# Patient Record
Sex: Male | Born: 1990 | Race: Black or African American | Hispanic: No | Marital: Single | Smoking: Current every day smoker
Health system: Southern US, Community
[De-identification: ages and names within clinical notes are randomized; demographics above are authoritative.]

---
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
## 2010-05-28 LAB — BASIC METABOLIC PANEL
BUN: 17 mg/dL (ref 6–23)
CO2: 22 mEq/L (ref 19–32)
CO2: 23 mEq/L (ref 19–32)
CO2: 26 mEq/L (ref 19–32)
CO2: 26 mEq/L (ref 19–32)
Calcium: 8.5 mg/dL (ref 8.4–10.5)
Calcium: 8.6 mg/dL (ref 8.4–10.5)
Calcium: 9.2 mg/dL (ref 8.4–10.5)
Chloride: 100 mEq/L (ref 96–112)
Chloride: 110 mEq/L (ref 96–112)
Chloride: 111 mEq/L (ref 96–112)
Chloride: 111 mEq/L (ref 96–112)
Creatinine, Ser: 1.15 mg/dL (ref 0.4–1.5)
Creatinine, Ser: 1.63 mg/dL — ABNORMAL HIGH (ref 0.4–1.5)
GFR calc Af Amer: >60 mL/min (ref >60)
GFR calc Af Amer: >60 mL/min (ref >60)
GFR calc Af Amer: >60 mL/min (ref >60)
GFR calc non Af Amer: >60 mL/min (ref >60)
Glucose, Bld: 112 mg/dL — ABNORMAL HIGH (ref 70–99)
Glucose, Bld: 80 mg/dL (ref 70–99)
Potassium: 4.5 mEq/L (ref 3.5–5.1)
Sodium: 138 mEq/L (ref 135–145)
Sodium: 142 mEq/L (ref 135–145)

## 2010-05-28 LAB — TSH: TSH: 1.808 u[IU]/mL (ref 0.350–4.500)

## 2010-05-28 LAB — URINE CULTURE
Colony Count: 15000
Culture  Setup Time: 201108080002
Special Requests: NEGATIVE

## 2010-05-28 LAB — RAPID URINE DRUG SCREEN, HOSP PERFORMED
Barbiturates: NOT DETECTED
Benzodiazepines: NOT DETECTED
Cocaine: NOT DETECTED

## 2010-05-28 LAB — CK TOTAL AND CKMB (NOT AT ARMC)
CK, MB: 13 ng/mL (ref 0.3–4.0)
CK, MB: 15 ng/mL (ref 0.3–4.0)
Relative Index: 0.3 (ref 0.0–2.5)
Total CK: 4792 U/L — ABNORMAL HIGH (ref 7–232)

## 2010-05-28 LAB — CBC
HCT: 37.1 % — ABNORMAL LOW (ref 39.0–52.0)
HCT: 39.8 % (ref 39.0–52.0)
Hemoglobin: 12.3 g/dL — ABNORMAL LOW (ref 13.0–17.0)
MCHC: 33.4 g/dL (ref 30.0–36.0)
MCV: 84.3 fL (ref 78.0–100.0)
RBC: 4.39 MIL/uL (ref 4.22–5.81)
RBC: 4.72 MIL/uL (ref 4.22–5.81)
RDW: 14.3 % (ref 11.5–15.5)
WBC: 12.1 10*3/uL — ABNORMAL HIGH (ref 4.0–10.5)
WBC: 8.1 10*3/uL (ref 4.0–10.5)

## 2010-05-28 LAB — URINALYSIS, ROUTINE W REFLEX MICROSCOPIC
Specific Gravity, Urine: 1.011 (ref 1.005–1.030)
pH: 5.5 (ref 5.0–8.0)

## 2010-05-28 LAB — PHOSPHORUS
Phosphorus: 2.7 mg/dL (ref 2.3–4.6)
Phosphorus: 5.6 mg/dL — ABNORMAL HIGH (ref 2.3–4.6)

## 2010-05-28 LAB — DIFFERENTIAL
Monocytes Absolute: 1 10*3/uL (ref 0.1–1.0)
Neutro Abs: 4.1 10*3/uL (ref 1.7–7.7)

## 2010-05-28 LAB — LACTIC ACID, PLASMA: Lactic Acid, Venous: 1.5 mmol/L (ref 0.5–2.2)

## 2010-05-28 LAB — MAGNESIUM: Magnesium: 1.9 mg/dL (ref 1.5–2.5)

## 2011-10-03 ENCOUNTER — Emergency Department (INDEPENDENT_AMBULATORY_CARE_PROVIDER_SITE_OTHER)
Admission: EM | Admit: 2011-10-03 | Discharge: 2011-10-03 | Disposition: A | Discharge status: Home / Self Care | Payer: Self-pay | Source: Home / Self Care | Attending: Emergency Medicine | Admitting: Emergency Medicine

## 2011-10-03 ENCOUNTER — Encounter (HOSPITAL_COMMUNITY): Payer: Self-pay

## 2011-10-03 DIAGNOSIS — S0502XA Injury of conjunctiva and corneal abrasion without foreign body, left eye, initial encounter: Secondary | ICD-10-CM

## 2011-10-03 DIAGNOSIS — S058X9A Other injuries of unspecified eye and orbit, initial encounter: Secondary | ICD-10-CM

## 2011-10-03 MED ORDER — TETRACAINE HCL 0.5 % OP SOLN
2.0000 [drp] | Freq: Once | OPHTHALMIC | Status: AC
  Administered 2011-10-03: 2 [drp] via OPHTHALMIC

## 2011-10-03 MED ORDER — TETRACAINE HCL 0.5 % OP SOLN
OPHTHALMIC | Status: AC
  Filled 2011-10-03: qty 2

## 2011-10-03 MED ORDER — ERYTHROMYCIN 5 MG/GM OP OINT: TOPICAL_OINTMENT | OPHTHALMIC | Status: AC

## 2011-10-03 MED ORDER — HYDROCODONE-ACETAMINOPHEN 5-500 MG PO TABS: 1.0000 | ORAL_TABLET | Freq: Four times a day (QID) | ORAL | Status: AC | PRN

## 2011-10-03 MED ORDER — POLYMYXIN B-TRIMETHOPRIM 10000-0.1 UNIT/ML-% OP SOLN: 1.0000 [drp] | Freq: Four times a day (QID) | OPHTHALMIC | Status: AC

## 2011-10-03 NOTE — ED Notes (Signed)
Reportedly has already been evaluated by professional at school for insect FB left eye 1 week ago, and used Rx eye drops w worsening symptoms , eye draining clear fluid, pain worse

## 2011-10-03 NOTE — ED Provider Notes (Signed)
History     CSN: [id number]  Arrival date & time 10/03/11  1401   First MD Initiated Contact with Patient 10/03/11 1404      Chief Complaint  Patient presents with  . Eye Problem    (Consider location/radiation/quality/duration/timing/severity/associated sxs/prior treatment) HPI Comments: 21 year old obese college male with no otherwise significant past medical history. Comes complaining of over week with left eye redness tearing and clear discharge. Symptoms also associated with photophobia. Does not wear contact lenses. Patient reports that he was running and a "gnat" insect got into his left eye since then, the left eye has been red, tender and tearing. Symptoms worsening during the last few days. He reported these symptoms to the college doctor over the phone and he prescribed a medication that the patient has been using without relief. Patient does not know the name of this medication.    History reviewed. No pertinent past medical history.  History reviewed. No pertinent past surgical history.  History reviewed. No pertinent family history.  History  Substance Use Topics  . Smoking status: Not on file  . Smokeless tobacco: Not on file  . Alcohol Use: Not on file      Review of Systems  Constitutional:       10 systems reviewed and  pertinent negative and positive symptoms are as per HPI.     Eyes: Positive for photophobia, pain and redness.       Eye tearing as per history of present illness  All other systems reviewed and are negative.    Allergies  Review of patient's allergies indicates not on file.  Home Medications   Current Outpatient Rx  Name Route Sig Dispense Refill  . ERYTHROMYCIN 5 MG/GM OP OINT  Place a 1/2 inch ribbon of ointment into the left lower eyelid every 6 hours for 5 days. 1 g 0  . HYDROCODONE-ACETAMINOPHEN 5-500 MG PO TABS Oral Take 1 tablet by mouth every 6 (six) hours as needed for pain. 10 tablet 0  . POLYMYXIN B-TRIMETHOPRIM  10000-0.1 UNIT/ML-% OP SOLN Left Eye Place 1 drop into the left eye every 6 (six) hours. 10 mL 0    BP 117/66  Pulse 51  Temp 98.6 F (37 C) (Oral)  Resp 18  SpO2 100%  Physical Exam  Nursing note and vitals reviewed. Constitutional: He is oriented to person, place, and time. He appears well-developed and well-nourished. No distress.       Obese uncomfortable, photophobic.   HENT:  Head: Normocephalic and atraumatic.  Mouth/Throat: Oropharynx is clear and moist.  Eyes: EOM are normal. Pupils are equal, round, and reactive to light.       Right eye: With normal examination . Left eye: Patient uncomfortable with photophobia improved after tetracaine application. Constant clear eye tearing. No penetrating trauma. Conjunctival erythema.  pericilliary reaction.  No foreign bodies on upper lid eversion and lower inner conjunctiva careful examination.  On fluorescein stain there are several dots uptake in the central cornea. There is a horizontal staining in the left lower corneal quadrant over the iris. No ulcers or pulling. No periorbital erythema, edema or tenderness to palpation. No obvious exudates   Neck: No thyromegaly present.  Cardiovascular: Normal heart sounds.   Pulmonary/Chest: Breath sounds normal.  Lymphadenopathy:    He has no cervical adenopathy.  Neurological: He is alert and oriented to person, place, and time.  Skin: No rash noted. He is not diaphoretic.    ED Course  Procedures (including critical care  time)  Labs Reviewed - No data to display No results found.   1. Corneal abrasion, left       MDM  Corneal linear abrasion on fluorescein staining. Concerning the length of the symptoms despite possible prior antibiotic tx. Case was discussed with Dr. Burgess Estelle ophthalmologist on call who would see patient for followup tomorrow in his office. Prescribed erythromycin ointment, Polytrim and Vicodin. Patient was instructed/encouraged to followup with the  ophthalmologist tomorrow.        Sharin Grave, MD 10/05/11 [phone]

## 2012-09-22 ENCOUNTER — Encounter (HOSPITAL_COMMUNITY): Payer: Self-pay | Admitting: Emergency Medicine

## 2012-09-22 ENCOUNTER — Inpatient Hospital Stay (HOSPITAL_COMMUNITY)
Admission: AD | Admit: 2012-09-22 | Discharge: 2012-09-23 | DRG: 881 | Disposition: A | Discharge status: Home / Self Care | Payer: No Typology Code available for payment source | Source: Intra-hospital | Attending: Psychiatry | Admitting: Psychiatry

## 2012-09-22 ENCOUNTER — Encounter (HOSPITAL_COMMUNITY): Payer: Self-pay | Admitting: *Deleted

## 2012-09-22 ENCOUNTER — Emergency Department (HOSPITAL_COMMUNITY)
Admission: EM | Admit: 2012-09-22 | Discharge: 2012-09-22 | Disposition: A | Discharge status: Other Acute Inpatient Hospital | Payer: Self-pay | Attending: Emergency Medicine | Admitting: Emergency Medicine

## 2012-09-22 DIAGNOSIS — F1010 Alcohol abuse, uncomplicated: Secondary | ICD-10-CM | POA: Insufficient documentation

## 2012-09-22 DIAGNOSIS — F329 Major depressive disorder, single episode, unspecified: Secondary | ICD-10-CM | POA: Insufficient documentation

## 2012-09-22 DIAGNOSIS — T1491XA Suicide attempt, initial encounter: Secondary | ICD-10-CM

## 2012-09-22 DIAGNOSIS — T50992A Poisoning by other drugs, medicaments and biological substances, intentional self-harm, initial encounter: Secondary | ICD-10-CM | POA: Insufficient documentation

## 2012-09-22 DIAGNOSIS — T466X4A Poisoning by antihyperlipidemic and antiarteriosclerotic drugs, undetermined, initial encounter: Secondary | ICD-10-CM | POA: Insufficient documentation

## 2012-09-22 DIAGNOSIS — Z79899 Other long term (current) drug therapy: Secondary | ICD-10-CM | POA: Insufficient documentation

## 2012-09-22 DIAGNOSIS — F411 Generalized anxiety disorder: Secondary | ICD-10-CM | POA: Diagnosis present

## 2012-09-22 DIAGNOSIS — F4321 Adjustment disorder with depressed mood: Principal | ICD-10-CM | POA: Diagnosis present

## 2012-09-22 DIAGNOSIS — F17200 Nicotine dependence, unspecified, uncomplicated: Secondary | ICD-10-CM | POA: Diagnosis present

## 2012-09-22 DIAGNOSIS — F3289 Other specified depressive episodes: Secondary | ICD-10-CM | POA: Insufficient documentation

## 2012-09-22 DIAGNOSIS — F322 Major depressive disorder, single episode, severe without psychotic features: Secondary | ICD-10-CM

## 2012-09-22 DIAGNOSIS — T50991A Poisoning by other drugs, medicaments and biological substances, accidental (unintentional), initial encounter: Secondary | ICD-10-CM | POA: Insufficient documentation

## 2012-09-22 LAB — ETHANOL: Alcohol, Ethyl (B): <11 mg/dL (ref 0–11)

## 2012-09-22 LAB — POCT I-STAT TROPONIN I: Troponin i, poc: 0 ng/mL (ref 0.00–0.08)

## 2012-09-22 LAB — CBC WITH DIFFERENTIAL/PLATELET
Basophils Absolute: 0 10*3/uL (ref 0.0–0.1)
Eosinophils Relative: 1 % (ref 0–5)
Lymphocytes Relative: 29 % (ref 12–46)
MCV: 81.1 fL (ref 78.0–100.0)
Neutrophils Relative %: 62 % (ref 43–77)
Platelets: 248 10*3/uL (ref 150–400)
RBC: 5.18 MIL/uL (ref 4.22–5.81)
RDW: 14.1 % (ref 11.5–15.5)
WBC: 7.8 10*3/uL (ref 4.0–10.5)

## 2012-09-22 LAB — SALICYLATE LEVEL: Salicylate Lvl: <2 mg/dL — ABNORMAL LOW (ref 2.8–20.0)

## 2012-09-22 LAB — BASIC METABOLIC PANEL
CO2: 26 mEq/L (ref 19–32)
Calcium: 9.8 mg/dL (ref 8.4–10.5)
GFR calc non Af Amer: 79 mL/min — ABNORMAL LOW (ref >90)
Potassium: 3.7 mEq/L (ref 3.5–5.1)
Sodium: 137 mEq/L (ref 135–145)

## 2012-09-22 LAB — RAPID URINE DRUG SCREEN, HOSP PERFORMED
Benzodiazepines: NOT DETECTED
Opiates: NOT DETECTED

## 2012-09-22 MED ORDER — ALUM & MAG HYDROXIDE-SIMETH 200-200-20 MG/5ML PO SUSP: 30.0000 mL | ORAL | Status: DC | PRN

## 2012-09-22 MED ORDER — NICOTINE 21 MG/24HR TD PT24
21.0000 mg | MEDICATED_PATCH | Freq: Every day | TRANSDERMAL | Status: DC
  Filled 2012-09-22: qty 1

## 2012-09-22 MED ORDER — NICOTINE 21 MG/24HR TD PT24
21.0000 mg | MEDICATED_PATCH | Freq: Every day | TRANSDERMAL | Status: DC
  Filled 2012-09-22 (×3): qty 1

## 2012-09-22 MED ORDER — ACETAMINOPHEN 500 MG PO TABS: 1000.0000 mg | ORAL_TABLET | Freq: Once | ORAL | Status: DC

## 2012-09-22 MED ORDER — ONDANSETRON HCL 4 MG PO TABS: 4.0000 mg | ORAL_TABLET | Freq: Three times a day (TID) | ORAL | Status: DC | PRN

## 2012-09-22 MED ORDER — ACETAMINOPHEN 325 MG PO TABS: 650.0000 mg | ORAL_TABLET | ORAL | Status: DC | PRN

## 2012-09-22 MED ORDER — MAGNESIUM HYDROXIDE 400 MG/5ML PO SUSP: 30.0000 mL | Freq: Every day | ORAL | Status: DC | PRN

## 2012-09-22 MED ORDER — LORAZEPAM 1 MG PO TABS: 1.0000 mg | ORAL_TABLET | Freq: Three times a day (TID) | ORAL | Status: DC | PRN

## 2012-09-22 MED ORDER — ACETAMINOPHEN 325 MG PO TABS: 650.0000 mg | ORAL_TABLET | Freq: Four times a day (QID) | ORAL | Status: DC | PRN

## 2012-09-22 NOTE — ED Notes (Signed)
WUJ:[id number]<JY> Expected date:09/22/12<BR> Expected time: 4:24 AM<BR> Means of arrival:Ambulance<BR> Comments:<BR> Overdose

## 2012-09-22 NOTE — BH Assessment (Signed)
Assessment Note   Earl Mccarthy is an 22 y.o. male.   Pt overdosed last night after girlfriend packed her stuff and left after pt caught her sexting.  Pt is depressed.  Denies HI and AVH and SA.  However pt does have occasional glass of wine or drink of alcohol but denies any regular use.  Pt and girlfriend just got an apartment together and pt is shocked she left.  Pt is a Consulting civil engineer at [employer].  Pt list his Aunt as a support.    See Nurse Practitioner note for additional clinical.  Pt can't reliably contract for safety even though presently he is admitting the suicide attempt was a mistake.    Pt has been accepted to a 500 hall floor at [facility] by [facility].    Axis I: Major Depression, single episode Axis II: Deferred Axis III: History reviewed. No pertinent past medical history. Axis IV: other psychosocial or environmental problems and problems related to social environment Axis V: 41-50 serious symptoms  Past Medical History: History reviewed. No pertinent past medical history.  History reviewed. No pertinent past surgical history.  Family History: No family history on file.  Social History:  has no tobacco, alcohol, and drug history on file.  Additional Social History:  Alcohol / Drug Use Pain Medications: na Prescriptions: na Over the Counter: na History of alcohol / drug use?: No history of alcohol / drug abuse Longest period of sobriety (when/how long): na  CIWA: CIWA-Ar BP: 120/80 mmHg Pulse Rate: 62 COWS:    Allergies:  Allergies  Allergen Reactions  . Bee Venom Anaphylaxis  . Strawberry Anaphylaxis    Home Medications:  (Not in a hospital admission)  OB/GYN Status:  No LMP for male patient.  General Assessment Data Location of Assessment: [facility] ED Living Arrangements: Alone Can pt return to current living arrangement?: Yes Admission Status: Voluntary Is patient capable of signing voluntary admission?: Yes Transfer from: Acute Hospital Referral Source:  MD  Education Status Is patient currently in school?: Yes Current Grade: college Highest grade of school patient has completed: college Name of school: [employer] Contact person: na  Risk to self Suicidal Ideation: No-Not Currently/Within Last 6 Months Suicidal Intent: No-Not Currently/Within Last 6 Months Is patient at risk for suicide?: Yes Suicidal Plan?: No-Not Currently/Within Last 6 Months Access to Means: Yes Specify Access to Suicidal Means: has medication What has been your use of drugs/alcohol within the last 12 months?: na Previous Attempts/Gestures: No How many times?: 0 Other Self Harm Risks: na Triggers for Past Attempts: None known Intentional Self Injurious Behavior: None Family Suicide History: No Recent stressful life event(s): Other (Comment) (girlfriend broke up with him) Persecutory voices/beliefs?: No Depression: Yes Depression Symptoms: Isolating;Fatigue;Guilt;Loss of interest in usual pleasures;Feeling worthless/self pity;Feeling angry/irritable Substance abuse history and/or treatment for substance abuse?: No Suicide prevention information given to non-admitted patients: Not applicable  Risk to Others Homicidal Ideation: No Thoughts of Harm to Others: No Current Homicidal Intent: No Current Homicidal Plan: No Access to Homicidal Means: No Identified Victim: na History of harm to others?: No Assessment of Violence: None Noted Violent Behavior Description: cooperative  Does patient have access to weapons?: No Criminal Charges Pending?: No Does patient have a court date: No  Psychosis Hallucinations: None noted Delusions: None noted  Mental Status Report Appear/Hygiene: Disheveled Eye Contact: Fair Motor Activity: Unremarkable Speech: Logical/coherent Level of Consciousness: Alert Mood: Depressed;Anxious;Worthless, low self-esteem;Sad Affect: Depressed;Blunted;Anxious;Sad Anxiety Level: Severe Thought Processes: Coherent Judgement:  Impaired Orientation: Person;Place;Situation;Appropriate for developmental  age Obsessive Compulsive Thoughts/Behaviors: None  Cognitive Functioning Concentration: Decreased Memory: Recent Intact;Remote Intact IQ: Average Insight: Poor Impulse Control: Poor Appetite: Fair Weight Loss: 0 Weight Gain: 0 Sleep: Decreased Total Hours of Sleep: 4 Vegetative Symptoms: None  ADLScreening [facility]) Patient's cognitive ability adequate to safely complete daily activities?: Yes Patient able to express need for assistance with ADLs?: Yes Independently performs ADLs?: Yes (appropriate for developmental age)  Abuse/Neglect [facility]) Physical Abuse: Denies Verbal Abuse: Denies Sexual Abuse: Denies  Prior Inpatient Therapy Prior Inpatient Therapy: No Prior Therapy Dates: na Prior Therapy Facilty/Provider(s): na Reason for Treatment: na  Prior Outpatient Therapy Prior Outpatient Therapy: No Prior Therapy Dates: na Prior Therapy Facilty/Provider(s): na Reason for Treatment: na  ADL Screening (condition at time of admission) Patient's cognitive ability adequate to safely complete daily activities?: Yes Is the patient deaf or have difficulty hearing?: Yes Does the patient have difficulty seeing, even when wearing glasses/contacts?: Yes Does the patient have difficulty concentrating, remembering, or making decisions?: Yes Patient able to express need for assistance with ADLs?: Yes Does the patient have difficulty dressing or bathing?: Yes Independently performs ADLs?: Yes (appropriate for developmental age) Does the patient have difficulty walking or climbing stairs?: Yes Weakness of Legs: None Weakness of Arms/Hands: None  Home Assistive Devices/Equipment Home Assistive Devices/Equipment: None  Therapy Consults (therapy consults require a physician order) PT Evaluation Needed: No OT Evalulation Needed: No SLP Evaluation Needed: No Abuse/Neglect Assessment (Assessment  to be complete while patient is alone) Physical Abuse: Denies Verbal Abuse: Denies Sexual Abuse: Denies Exploitation of patient/patient's resources: Denies Self-Neglect: Denies Values / Beliefs Cultural Requests During Hospitalization: None Spiritual Requests During Hospitalization: None Consults Spiritual Care Consult Needed: No Social Work Consult Needed: No Merchant navy officer (For Healthcare) Advance Directive: Patient does not have advance directive Pre-existing out of facility DNR order (yellow form or pink MOST form): No    Additional Information 1:1 In Past 12 Months?: No CIRT Risk: No Elopement Risk: No Does patient have medical clearance?: Yes     Disposition:  Disposition Initial Assessment Completed for this Encounter: Yes Disposition of Patient: Inpatient treatment program Type of inpatient treatment program: Adult  On Site Evaluation by:   Reviewed with Physician:     Titus Mould, Eppie Gibson 09/22/2012 11:06 AM

## 2012-09-22 NOTE — Progress Notes (Signed)
Patient ID: Earl Mccarthy, male   DOB: 08/23/1989, 22 y.o.   MRN: [id number]  D: Pt denies SI/HI/AVH. Pt is pleasant and cooperative. Pt states "I'm her just because I was wilin out, I got drunk and just took some pills and my phone was dead and no one could get in touch with me". Pt concerned about animals left in apartment with no food and water for over 24 hrs (cats and a dog). Pt has depressed look and is obsessed with leaving " I got 2 jobs and I have too many people counting on me to be up in here"   A: Pt was offered support and encouragement. Pt was given scheduled medications. Pt was encourage to attend groups. Q 15 minute checks were done for safety.   R:Pt attends groups and interacts well with peers and staff. Pt is taking medication. Pt has no complaints at this time.Pt receptive to treatment and safety maintained on unit.

## 2012-09-22 NOTE — ED Provider Notes (Signed)
   History    CSN: [id number] Arrival date & time 09/22/12  0430  First MD Initiated Contact with Patient 09/22/12 0456     Chief Complaint  Patient presents with  . Suicide Attempt   (Consider location/radiation/quality/duration/timing/severity/associated sxs/prior Treatment) HPI History provided by pt.   Pt attempted suicide sometime last night or early this morning.  Drank a large amt of alcohol and then took several hydroxycut and cellucor (main ingredient linoleic acid).  Has been very depressed lately.  Has never been treated for psychiatric illness in the past.  Denies HI as well as illicit drug abuse.    History reviewed. No pertinent past medical history. History reviewed. No pertinent past surgical history. No family history on file. History  Substance Use Topics  . Smoking status: Not on file  . Smokeless tobacco: Not on file  . Alcohol Use: Not on file    Review of Systems  All other systems reviewed and are negative.    Allergies  Bee venom and Strawberry  Home Medications   Current Outpatient Rx  Name  Route  Sig  Dispense  Refill  . OVER THE COUNTER MEDICATION   Oral   Take 1 tablet by mouth 3 (three) times daily. OTC weight loss supplement          BP 147/78  Pulse 80  Temp(Src) 98.1 F (36.7 C) (Oral)  Resp 20  SpO2 100% Physical Exam  Nursing note and vitals reviewed. Constitutional: He is oriented to person, place, and time. He appears well-developed and well-nourished. No distress.  HENT:  Head: Normocephalic and atraumatic.  Eyes:  Normal appearance  Neck: Normal range of motion.  Cardiovascular: Normal rate and regular rhythm.   Pulmonary/Chest: Effort normal and breath sounds normal. No respiratory distress.  Musculoskeletal: Normal range of motion.  Neurological: He is alert and oriented to person, place, and time.  Skin: Skin is warm and dry. No rash noted.  Psychiatric: He has a normal mood and affect. His behavior is normal.     ED Course  Procedures (including critical care time)  Date: 09/22/2012  Rate: 61  Rhythm: normal sinus rhythm  QRS Axis: normal  Intervals: PR shortened  ST/T Wave abnormalities: nonspecific T wave changes  Conduction Disutrbances:none  Narrative Interpretation:   Old EKG Reviewed: none available  Labs Reviewed  CBC WITH DIFFERENTIAL  BASIC METABOLIC PANEL  ETHANOL  SALICYLATE LEVEL  URINE RAPID DRUG SCREEN (HOSP PERFORMED)   No results found. 1. Suicide attempt     MDM  22yo healthy M presents w/ suicide attempt.  Drank a large amt of alcohol and took several hydroxycut and cellcor (lineolic acid).  Medically cleared w/ exception of non-specific T wave changes and shortened PR interval on EKG.  0 and 3hr troponins ordered.  Pt will need cardiology referral at time of discharge.  Holding orders written.  He wanted to be discharged but I explained that he would have to be involuntarily committed if he attempted to leave.  6:54 AM     Otilio Miu, PA-C 09/22/12 [phone]

## 2012-09-22 NOTE — ED Notes (Signed)
Report given to [facility]. Will notify security of transportation need to [facility].

## 2012-09-22 NOTE — Tx Team (Signed)
Initial Interdisciplinary Treatment Plan  PATIENT STRENGTHS: (choose at least two) Ability for insight Average or above average intelligence Communication skills Supportive family/friends Work skills  PATIENT STRESSORS: Loss of girlfriend   PROBLEM LIST: Problem List/Patient Goals Date to be addressed Date deferred Reason deferred Estimated date of resolution  Major depression ,single episode 09-22-2012           Suicide ideation  09-22-2012           Loss of girlfriend  And child  09-22-12                              DISCHARGE CRITERIA:  Improved stabilization in mood, thinking, and/or behavior Reduction of life-threatening or endangering symptoms to within safe limits  PRELIMINARY DISCHARGE PLAN: Attend aftercare/continuing care group Return to previous living arrangement Return to previous work or school arrangements  PATIENT/FAMIILY INVOLVEMENT: This treatment plan has been presented to and reviewed with the patient, Earl Mccarthy, and/or family member, .  The patient and family have been given the opportunity to ask questions and make suggestions.  Valente David 09/22/2012, 1:17 PM

## 2012-09-22 NOTE — ED Notes (Signed)
Ambulatory without difficulty. No complaints voiced. Belongings bag x1 given to hospital security. Transported to [facility] by security and mental health tech.

## 2012-09-22 NOTE — Consult Note (Signed)
Reason for Consult: Evaluation for inpatient treatment Referring Physician:  EDP  Earl Mccarthy is an 22 y.o. male.  HPI:  Patient presents to  [facility] after drinking a bottle of wine and intentional overdose of hydroxycut.  Patient states that he has multiple stressors work, school, and family.  Patient states that he and the mother of his child (girlfriend) move in together.  Last night girlfriend move out after patient asked her to stop sexting other guys.  Patient states that he found out after he was sent one of the sexting text by accident; states that girlfriend has also been caught cheating before.  Patient states that the stressor which caused the overdose is "I want to be a part of my sons life."  Patient states that he has been depressed for a while but it has worsened since the birth of the child and the attitude of the girlfriend.  Patient states that he dos not have a history of behavioral or psychiatric problems.  States that his girlfriend is bipolar and he has been with her to therapy but she refuses to take her medications.   Patient is willing to do outpatient therapy through [employer] after his discharge.   History reviewed. No pertinent past medical history.  History reviewed. No pertinent past surgical history.  No family history on file.  Social History:  has no tobacco, alcohol, and drug history on file.  Allergies:  Allergies  Allergen Reactions  . Bee Venom Anaphylaxis  . Strawberry Anaphylaxis    Medications: I have reviewed the patient's current medications.  Results for orders placed during the hospital encounter of 09/22/12 (from the past 48 hour(s))  CBC WITH DIFFERENTIAL     Status: None   Collection Time    09/22/12  6:25 AM      Result Value Range   WBC 7.8  4.0 - 10.5 K/uL   RBC 5.18  4.22 - 5.81 MIL/uL   Hemoglobin 14.4  13.0 - 17.0 g/dL   HCT 19.1  47.8 - 29.5 %   MCV 81.1  78.0 - 100.0 fL   MCH 27.8  26.0 - 34.0 pg   MCHC 34.3  30.0 - 36.0  g/dL   RDW 62.1  30.8 - 65.7 %   Platelets 248  150 - 400 K/uL   Neutrophils Relative % 62  43 - 77 %   Neutro Abs 4.8  1.7 - 7.7 K/uL   Lymphocytes Relative 29  12 - 46 %   Lymphs Abs 2.3  0.7 - 4.0 K/uL   Monocytes Relative 9  3 - 12 %   Monocytes Absolute 0.7  0.1 - 1.0 K/uL   Eosinophils Relative 1  0 - 5 %   Eosinophils Absolute 0.1  0.0 - 0.7 K/uL   Basophils Relative 0  0 - 1 %   Basophils Absolute 0.0  0.0 - 0.1 K/uL  BASIC METABOLIC PANEL     Status: Abnormal   Collection Time    09/22/12  6:25 AM      Result Value Range   Sodium 137  135 - 145 mEq/L   Potassium 3.7  3.5 - 5.1 mEq/L   Chloride 102  96 - 112 mEq/L   CO2 26  19 - 32 mEq/L   Glucose, Bld 111 (*) 70 - 99 mg/dL   BUN 13  6 - 23 mg/dL   Creatinine, Ser 8.46  0.50 - 1.35 mg/dL   Calcium 9.8  8.4 - 96.2  mg/dL   GFR calc non Af Amer 79 (*) >90 mL/min   GFR calc Af Amer >90  >90 mL/min   Comment:            The eGFR has been calculated     using the CKD EPI equation.     This calculation has not been     validated in all clinical     situations.     eGFR's persistently     <90 mL/min signify     possible Chronic Kidney Disease.  ETHANOL     Status: None   Collection Time    09/22/12  6:25 AM      Result Value Range   Alcohol, Ethyl (B) <11  0 - 11 mg/dL   Comment:            LOWEST DETECTABLE LIMIT FOR     SERUM ALCOHOL IS 11 mg/dL     FOR MEDICAL PURPOSES ONLY  SALICYLATE LEVEL     Status: Abnormal   Collection Time    09/22/12  6:25 AM      Result Value Range   Salicylate Lvl <2.0 (*) 2.8 - 20.0 mg/dL  URINE RAPID DRUG SCREEN (HOSP PERFORMED)     Status: None   Collection Time    09/22/12  8:07 AM      Result Value Range   Opiates NONE DETECTED  NONE DETECTED   Cocaine NONE DETECTED  NONE DETECTED   Benzodiazepines NONE DETECTED  NONE DETECTED   Amphetamines NONE DETECTED  NONE DETECTED   Tetrahydrocannabinol NONE DETECTED  NONE DETECTED   Barbiturates NONE DETECTED  NONE DETECTED    Comment:            DRUG SCREEN FOR MEDICAL PURPOSES     ONLY.  IF CONFIRMATION IS NEEDED     FOR ANY PURPOSE, NOTIFY LAB     WITHIN 5 DAYS.                LOWEST DETECTABLE LIMITS     FOR URINE DRUG SCREEN     Drug Class       Cutoff (ng/mL)     Amphetamine      1000     Barbiturate      200     Benzodiazepine   200     Tricyclics       300     Opiates          300     Cocaine          300     THC              50  POCT I-STAT TROPONIN I     Status: None   Collection Time    09/22/12  8:23 AM      Result Value Range   Troponin i, poc 0.00  0.00 - 0.08 ng/mL   Comment 3            Comment: Due to the release kinetics of cTnI,     a negative result within the first hours     of the onset of symptoms does not rule out     myocardial infarction with certainty.     If myocardial infarction is still suspected,     repeat the test at appropriate intervals.    No results found.  Review of Systems  Psychiatric/Behavioral: Positive for depression and suicidal ideas. Negative for hallucinations, memory loss  and substance abuse. The patient is nervous/anxious. The patient does not have insomnia.    Blood pressure 120/80, pulse 62, temperature 97.7 F (36.5 C), temperature source Oral, resp. rate 18, SpO2 99.00%. Physical Exam  Constitutional: He is oriented to person, place, and time. He appears well-developed and well-nourished.  HENT:  Head: Normocephalic and atraumatic.  Neck: Normal range of motion.  Respiratory: Effort normal.  Musculoskeletal: Normal range of motion.  Neurological: He is alert and oriented to person, place, and time.  Skin: Skin is warm and dry.  Psychiatric: His speech is normal and behavior is normal. His mood appears anxious. Cognition and memory are normal. He expresses impulsivity. He exhibits a depressed mood. He expresses suicidal ideation. He expresses suicidal plans.   Face to face interview and consulted with Dr. Lolly Mustache Assessment/Plan: Axis I:  Major Depression, single episode  Recommendation:  Inpatient treatment.  Patient accepted [facility] 508/02.  1. Admit for crisis management and stabilization.  2. Review and initiate  medications pertinent to patient illness and treatment.  3. Medication management to reduce current symptoms to base line and improve the         patient's overall level of functioning.   Rankin, Shuvon, FNP-BC 09/22/2012, 11:07 AM     I have personally seen the patient and agreed with the findings and involved in the treatment plan. Kathryne Sharper, MD

## 2012-09-22 NOTE — ED Notes (Signed)
Pt arrived via EMS with a complaint of suicidal intent.  Pt states that his girlfriend has left him, he drank heavily and contacted his ex-girlfriend and made statements that he was going to cut himself.  Pt has taken a mixture of dietary supplements this am.  Pt is having family problems as well and is having a difficult time not being in control.

## 2012-09-22 NOTE — ED Provider Notes (Signed)
Medical screening examination/treatment/procedure(s) were performed by non-physician practitioner and as supervising physician I was immediately available for consultation/collaboration.  Liv Rallis, MD 09/22/12 2302 

## 2012-09-22 NOTE — BH Assessment (Signed)
[facility] Assessment Progress Note  Spoke to Scherrie Merritts, LCSW, Assessment Counselor.  He reports that [employer], FNP has seen pt and accepted him to [facility] pending bed availability.  Spoke to Berneice Heinrich, RN, Neurosurgeon, who assigned pt to Rm [phone] to the service of Leata Mouse, MD.  At 11:20 I called back to Reita Cliche to notify him.  Doylene Canning, MA Assessment Counselor 09/22/2012 @ 11:21
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
## 2012-09-23 DIAGNOSIS — F4321 Adjustment disorder with depressed mood: Principal | ICD-10-CM

## 2012-09-23 NOTE — Progress Notes (Signed)
[facility] Adult Case Management Discharge Plan :  Will you be returning to the same living situation after discharge: No.  Will be staying with his aunt a few days. At discharge, do you have transportation home?:Yes,  aunt Do you have the ability to pay for your medications:  N/A - none prescribed  Release of information consent forms completed and in the chart;  Patient's signature needed at discharge.  Patient to Follow up at: Follow-up Information   Schedule an appointment as soon as possible for a visit with [facility] . (Patient and his aunt are taking responsibility for making an appointment for individual counseling as soon as possible.)    Contact information:   Lucy Chris, Room [address] (T) [phone] Monday - Friday; 8am - 5pm        Patient denies SI/HI:   Yes,  adamantly denies suicidality    Safety Planning and Suicide Prevention discussed:  Yes,  with patient and with aunt, and brochure given  Sarina Ser 09/23/2012, 12:47 PM

## 2012-09-23 NOTE — [facility] Suicide Risk Assessment (Signed)
Suicide Risk Assessment  Discharge Assessment     Demographic Factors:  Male  Mental Status Per Nursing Assessment::   On Admission:  Suicidal ideation indicated by patient  Current Mental Status by Physician: Patient seen.  Patient denies any suicidal thoughts or homicidal thoughts.  Patient to he was very upset because his girlfriend was out of town.  Patient denies any hallucination paranoia or any obsessive thoughts.  Denies any active or passive suicidal thoughts at this time.  He is cooperative pleasant and maintained good eye contact.  His speech is clear and coherent.  His thought process is logical.  He denies any auditory or visual hallucination.  He's alert and oriented x3.  His insight judgment and impulse control is okay.  Loss Factors: Decrease in vocational status  Historical Factors: NA  Risk Reduction Factors:   Sense of responsibility to family, Religious beliefs about death, Employed, Living with another person, especially a relative, Positive social support, Positive therapeutic relationship and Positive coping skills or problem solving skills  Continued Clinical Symptoms:  Severe Anxiety and/or Agitation Dysthymia  Cognitive Features That Contribute To Risk:  Polarized thinking    Suicide Risk:  Mild:  Suicidal ideation of limited frequency, intensity, duration, and specificity.  There are no identifiable plans, no associated intent, mild dysphoria and related symptoms, good self-control (both objective and subjective assessment), few other risk factors, and identifiable protective factors, including available and accessible social support.  Discharge Diagnoses:   AXIS I:  Adjustment Disorder with Depressed Mood AXIS II:  Deferred AXIS III:  History reviewed. No pertinent past medical history. AXIS IV:  problems with primary support group AXIS V:  61-70 mild symptoms  Plan Of Care/Follow-up recommendations:  Activity:  As tolerated Diet:  Unchanged from  the past Other:  Patient will be discharged to his on.  Patient agreed to followup with counselor at [facility]. patient is not on any medication.  Is patient on multiple antipsychotic therapies at discharge:  No   Has Patient had three or more failed trials of antipsychotic monotherapy by history:  No  Recommended Plan for Multiple Antipsychotic Therapies: Taper to monotherapy as described:  Patient is not taking any psychotropic medication.  He will be followed at the counselor to deal with his psychosocial issues.  Earl Mccarthy T. 09/23/2012, 12:53 PM

## 2012-09-23 NOTE — Clinical Social Work Psych Note (Signed)
CSW met with patient in his room to discuss possible discharge today.  He admitted that he really felt at the moment of his suicide attempt that he did not need to be in the world.  He states, however, that he has seen people in the hospital who truly deal with these issues every day of their lives, and he does not believe his problems are as big as he had thought.  He readily admits that his alcohol consumption exacerbated his feelings, and that he does not normally drink so this was worsened.  He expressed understanding that he now knows the negative effect alcohol has on his thinking processes when he is upset and that he needs to refrain from alcohol consumption under those circumstances.  Patient was asked directly about his comments in group yesterday about cutting himself, when in fact his assessment did not state anything about cuts.  He stated that indeed he did not cut himself and he was speaking metaphorically when he said he had "cut too deep", meaning that he had gone too far.    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  His 27mo son is a motivator for him to live and make a different life for the son than he had himself, as he had to be raised by his aunt due to conflict between his parents.  He stated that "even if she can't be in my life" (meaning his girlfriend, mother of his baby), I will be okay because I will be there for my son, no matter what.  He anticipates that a custody battle may be coming up soon, and he agrees that counseling throughout that process would be helpful.  Provided patient with SPE, including the fact that he is at greater risk since he has shown a willingness to make a suicide attempt.  Met with aunt, see family SPE note.  She is taking responsibility for him, will take him to stay with her at least a few days.  Agrees he can return to work Tuesday, and letter was given to that effect.  Ambrose Mantle, LCSW 09/23/2012, 12:55 PM

## 2012-09-23 NOTE — Progress Notes (Signed)
[facility] Group Notes:  (Nursing/MHT/Case Management/Adjunct)  Date:  7/12/214 Time:  2000  Type of Therapy:  Psychoeducational Skills  Participation Level:  Active  Participation Quality:  Appropriate  Affect:  Appropriate  Cognitive:  Appropriate  Insight:  Lacking  Engagement in Group:  Engaged  Modes of Intervention:  Education  Summary of Progress/Problems: The patient expressed in group that he enjoyed the groups that were held earlier in the day. He admitted that he is having difficulty adjusting to the hospital since he is accustomed to being more independent. His goal for tomorrow is to get discharged.   Caileen Veracruz S 09/23/2012, 1:47 AM

## 2012-09-23 NOTE — Progress Notes (Signed)
D) Pt is being discharged to home accompanied by his mother. Mood and affect are appropriate. Denies SI and HI. States he feels bad about the girlfriend who is the mother of his baby, but will not attempt to hurt himself again. States it was "a stupid move and it won't happen again".  Verbalizes concern over the animals that were left in his care. Pt's mother was present and states that she will be staying with him for awhile and making sure that he does his follow up care. A) All belongings returned to Pt. Given support, reassurance, praise and encouragement. Provided with a 1:1 R) Pt deneis SI and HI. Plans to follow up with his outpatient plan

## 2012-09-23 NOTE — Progress Notes (Signed)
The focus of this group is to help patients establish daily goals to achieve during treatment and discuss how the patient can incorporate goal setting into their daily lives to aide in recovery.  Pt did not attend. Pt met with NP
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
## 2012-09-23 NOTE — [facility] Suicide Risk Assessment (Signed)
[facility] INPATIENT:  Family/Significant Other Suicide Prevention Education  Suicide Prevention Education:  Education Completed; Earl Mccarthy [phone] has been identified by the patient as the family member/significant other with whom the patient will be residing, and identified as the person(s) who will aid the patient in the event of a mental health crisis (suicidal ideations/suicide attempt).  With written consent from the patient, the family member/significant other has been provided the following suicide prevention education, prior to the and/or following the discharge of the patient.  The suicide prevention education provided includes the following:  Suicide risk factors  Suicide prevention and interventions  National Suicide Hotline telephone number  [facility] assessment telephone number  [facility] Emergency Assistance 911  [facility] and/or Residential Mobile Crisis Unit telephone number  Request made of family/significant other to:  Remove weapons (e.g., guns, rifles, knives), all items previously/currently identified as safety concern.  Aunt states there are no weapons in the home other than kitchen knives.  Remove drugs/medications (over-the-counter, prescriptions, illicit drugs), all items previously/currently identified as a safety concern.  The family member/significant other verbalizes understanding of the suicide prevention education information provided.  The family member/significant other agrees to remove the items of safety concern listed above.  Aunt is a Teaching laboratory technician and is knowledgeable about these issues.  Patient will live with her for a few days.  Earl Mccarthy 09/23/2012, 12:42 PM

## 2012-09-25 NOTE — Progress Notes (Signed)
Patient Discharge Instructions:  After Visit Summary (AVS):   Faxed to:  09/25/12 Discharge Summary Note:   Faxed to:  09/25/12 Psychiatric Admission Assessment Note:   Faxed to:  09/25/12 Suicide Risk Assessment - Discharge Assessment:   Faxed to:  09/25/12 Faxed/Sent to the Next Level Care provider:  09/25/12 Faxed to [facility] @ [phone]  Jerelene Redden, 09/25/2012, 4:03 PM

## 2013-06-11 ENCOUNTER — Encounter (HOSPITAL_COMMUNITY): Payer: Self-pay | Admitting: Emergency Medicine

## 2013-06-11 ENCOUNTER — Emergency Department (HOSPITAL_COMMUNITY)
Admission: EM | Admit: 2013-06-11 | Discharge: 2013-06-12 | Disposition: A | Discharge status: Home / Self Care | Payer: Self-pay | Attending: Emergency Medicine | Admitting: Emergency Medicine

## 2013-06-11 DIAGNOSIS — F3289 Other specified depressive episodes: Secondary | ICD-10-CM | POA: Insufficient documentation

## 2013-06-11 DIAGNOSIS — R55 Syncope and collapse: Secondary | ICD-10-CM

## 2013-06-11 DIAGNOSIS — IMO0002 Reserved for concepts with insufficient information to code with codable children: Secondary | ICD-10-CM | POA: Insufficient documentation

## 2013-06-11 DIAGNOSIS — S99929A Unspecified injury of unspecified foot, initial encounter: Secondary | ICD-10-CM | POA: Insufficient documentation

## 2013-06-11 DIAGNOSIS — S8990XA Unspecified injury of unspecified lower leg, initial encounter: Secondary | ICD-10-CM | POA: Insufficient documentation

## 2013-06-11 DIAGNOSIS — S199XXA Unspecified injury of neck, initial encounter: Secondary | ICD-10-CM

## 2013-06-11 DIAGNOSIS — S99919A Unspecified injury of unspecified ankle, initial encounter: Secondary | ICD-10-CM

## 2013-06-11 DIAGNOSIS — Y92410 Unspecified street and highway as the place of occurrence of the external cause: Secondary | ICD-10-CM | POA: Insufficient documentation

## 2013-06-11 DIAGNOSIS — M542 Cervicalgia: Secondary | ICD-10-CM

## 2013-06-11 DIAGNOSIS — M549 Dorsalgia, unspecified: Secondary | ICD-10-CM

## 2013-06-11 DIAGNOSIS — Y9389 Activity, other specified: Secondary | ICD-10-CM | POA: Insufficient documentation

## 2013-06-11 DIAGNOSIS — S0993XA Unspecified injury of face, initial encounter: Secondary | ICD-10-CM | POA: Insufficient documentation

## 2013-06-11 DIAGNOSIS — F329 Major depressive disorder, single episode, unspecified: Secondary | ICD-10-CM | POA: Insufficient documentation

## 2013-06-11 DIAGNOSIS — F17200 Nicotine dependence, unspecified, uncomplicated: Secondary | ICD-10-CM | POA: Insufficient documentation
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
## 2013-06-12 ENCOUNTER — Encounter (HOSPITAL_COMMUNITY): Payer: Self-pay | Admitting: Radiology

## 2013-06-12 ENCOUNTER — Emergency Department (HOSPITAL_COMMUNITY): Payer: Self-pay

## 2013-06-12 ENCOUNTER — Emergency Department (HOSPITAL_COMMUNITY): Payer: No Typology Code available for payment source

## 2013-06-12 LAB — I-STAT CHEM 8, ED
BUN: 11 mg/dL (ref 6–23)
Calcium, Ion: 1.2 mmol/L (ref 1.12–1.23)
Chloride: 102 mEq/L (ref 96–112)
Creatinine, Ser: 1.4 mg/dL — ABNORMAL HIGH (ref 0.50–1.35)
Glucose, Bld: 92 mg/dL (ref 70–99)
HCT: 45 % (ref 39.0–52.0)
HEMOGLOBIN: 15.3 g/dL (ref 13.0–17.0)
Potassium: 4.3 mEq/L (ref 3.7–5.3)
SODIUM: 141 meq/L (ref 137–147)
TCO2: 26 mmol/L (ref 0–100)

## 2013-06-12 MED ORDER — HYDROCODONE-ACETAMINOPHEN 5-325 MG PO TABS
2.0000 | ORAL_TABLET | Freq: Once | ORAL | Status: AC
  Administered 2013-06-12: 2 via ORAL
  Filled 2013-06-12: qty 2
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
## 2013-06-12 NOTE — Discharge Instructions (Signed)
Follow up with local physician or heart doctor for further evaluation of possible syncope (passing out). Return if you develop persistent numbness, weakness, urinary changes, passing out or chest pain. Call to schedule your MRI of your neck and followup with a local physician for the results. If you were given medicines take as directed.  If you are on coumadin or contraceptives realize their levels and effectiveness is altered by many different medicines.  If you have any reaction (rash, tongues swelling, other) to the medicines stop taking and see a physician.   Please follow up as directed and return to the ER or see a physician for new or worsening symptoms.  Thank you. Filed Vitals:   06/12/13 0052 06/12/13 0139 06/12/13 0215 06/12/13 0245  BP: 138/74  113/50 107/56  Pulse: 79 60 65 52  Temp:      TempSrc:      Resp: 19 16    SpO2: 99% 100% 98% 99%

## 2015-09-27 ENCOUNTER — Emergency Department (HOSPITAL_COMMUNITY)
Admission: EM | Admit: 2015-09-27 | Discharge: 2015-09-28 | Disposition: A | Discharge status: Home / Self Care | Payer: Managed Care, Other (non HMO) | Attending: Emergency Medicine | Admitting: Emergency Medicine

## 2015-09-27 ENCOUNTER — Encounter (HOSPITAL_COMMUNITY): Payer: Self-pay | Admitting: Oncology

## 2015-09-27 DIAGNOSIS — R300 Dysuria: Secondary | ICD-10-CM | POA: Insufficient documentation

## 2015-09-27 DIAGNOSIS — R369 Urethral discharge, unspecified: Secondary | ICD-10-CM

## 2015-09-27 DIAGNOSIS — R110 Nausea: Secondary | ICD-10-CM | POA: Insufficient documentation

## 2015-09-27 DIAGNOSIS — F17210 Nicotine dependence, cigarettes, uncomplicated: Secondary | ICD-10-CM | POA: Diagnosis not present

## 2015-09-27 DIAGNOSIS — Z113 Encounter for screening for infections with a predominantly sexual mode of transmission: Secondary | ICD-10-CM | POA: Diagnosis not present

## 2015-09-27 NOTE — ED Notes (Signed)
Pt developed a whitish yellow discharge from his penis as well as burning w/ urination.  Sx started on 7/14.  Has had unprotected sex w/ his ex girlfriend.

## 2015-09-28 MED ORDER — LIDOCAINE HCL 1 % IJ SOLN
INTRAMUSCULAR | Status: AC
  Administered 2015-09-28: 0.9 mL
  Filled 2015-09-28: qty 20

## 2015-09-28 MED ORDER — AZITHROMYCIN 250 MG PO TABS
1000.0000 mg | ORAL_TABLET | Freq: Once | ORAL | Status: AC
  Administered 2015-09-28: 1000 mg via ORAL
  Filled 2015-09-28: qty 4

## 2015-09-28 MED ORDER — CEFTRIAXONE SODIUM 250 MG IJ SOLR
250.0000 mg | Freq: Once | INTRAMUSCULAR | Status: AC
  Administered 2015-09-28: 250 mg via INTRAMUSCULAR
  Filled 2015-09-28: qty 250

## 2015-09-28 NOTE — ED Provider Notes (Signed)
CSN: [id number]     Arrival date & time 09/27/15  2349 History  By signing my name below, I, Earl Mccarthy, attest that this documentation has been prepared under the direction and in the presence of Earl Mccarthy, [place]. Electronically Signed: Bridgette HabermannMaria Mccarthy, ED Scribe. 09/28/2015. 1:28 AM.   Chief Complaint  Patient presents with  . SEXUALLY TRANSMITTED DISEASE   The history is provided by the patient. No language interpreter was used.    HPI Comments: Earl Mccarthy is a 25 y.o. male who presents to the Emergency Department complaining of white/yellow penile discharge onset 09/25/15. Pt also has dysuria and mild nausea. Pt is sexually active and states he had unprotected sex with his ex girlfriend within the last week. Pt denies fever, testicular swelling/pain, and vomiting. Pt has no other complaints at this time.  History reviewed. No pertinent past medical history. History reviewed. No pertinent past surgical history. No family history on file. Social History  Substance Use Topics  . Smoking status: Current Every Day Smoker -- 0.25 packs/day for .5 years    Types: Cigarettes  . Smokeless tobacco: None  . Alcohol Use: Yes    Review of Systems A complete 10 system review of systems was obtained and all systems are negative except as noted in the HPI and PMH.   Allergies  Bee venom and Strawberry extract  Home Medications   Prior to Admission medications   Not on File   BP 150/76 mmHg  Pulse 94  Temp(Src) 98.3 F (36.8 C) (Oral)  Resp 18  Ht 6\' 4"  (1.93 m)  Wt 340 lb (154.223 kg)  BMI 41.40 kg/m2  SpO2 98% Physical Exam  Constitutional: He appears well-developed and well-nourished.  HENT:  Head: Normocephalic.  Eyes: Conjunctivae are normal.  Cardiovascular: Normal rate.   Pulmonary/Chest: Effort normal. No respiratory distress.  Abdominal: He exhibits no distension.  Genitourinary: Right testis shows no swelling and no tenderness. Left testis shows no swelling and no  tenderness. Uncircumcised. No penile erythema or penile tenderness. Discharge found.  Chaperone present throughout entire exam.   Musculoskeletal: Normal range of motion.  Neurological: He is alert.  Skin: Skin is warm and dry.  Psychiatric: He has a normal mood and affect. His behavior is normal.  Nursing note and vitals reviewed.   ED Course  Procedures  DIAGNOSTIC STUDIES: Oxygen Saturation is 98% on RA, normal by my interpretation.    COORDINATION OF CARE: 1:26 AM Discussed treatment plan with pt at bedside which includes urinalysis and lab work and pt agreed to plan.  Labs Review Labs Reviewed  HIV ANTIBODY (ROUTINE TESTING)  RPR  GC/CHLAMYDIA PROBE AMP [facility]    Imaging Review No results found. I have personally reviewed and evaluated these images and lab results as part of my medical decision-making.   EKG Interpretation None      MDM   Final diagnoses:  Penile discharge  Encounter for screening examination for sexually transmitted disease    GC chlamydia, HIV, RPR sent. Treated empirically today with rocephin and azithromycin. Safe sex practices discussed. Instructed pcp f/u. ER return precautions given.   I personally performed the services described in this documentation, which was scribed in my presence. The recorded information has been reviewed and is accurate.   Carlene CoriaSerena Y Vence Lalor, PA-C 09/28/15 [phone]  Shon Batonourtney F Horton, MD 09/28/15 2256

## 2015-09-28 NOTE — ED Notes (Signed)
Attempted to draw labs was unsuccessful. Forde DandyRn Elaine made aware.

## 2015-09-28 NOTE — Discharge Instructions (Signed)
You were treated empirically today for gonorrhea and syphilis. We will call you if any of your tests come back positive. Abstain from sex for at least one week. Always use a condom. Return to the ER for new or worsening symptoms.

## 2015-09-29 LAB — HIV ANTIBODY (ROUTINE TESTING W REFLEX): HIV Screen 4th Generation wRfx: NONREACTIVE

## 2015-09-29 LAB — RPR: RPR: NONREACTIVE

## 2017-10-11 ENCOUNTER — Emergency Department (HOSPITAL_COMMUNITY)
Admission: EM | Admit: 2017-10-11 | Discharge: 2017-10-11 | Disposition: A | Discharge status: Home / Self Care | Payer: Managed Care, Other (non HMO) | Attending: Emergency Medicine | Admitting: Emergency Medicine

## 2017-10-11 ENCOUNTER — Encounter (HOSPITAL_COMMUNITY): Payer: Self-pay | Admitting: Emergency Medicine

## 2017-10-11 ENCOUNTER — Other Ambulatory Visit: Payer: Self-pay

## 2017-10-11 ENCOUNTER — Emergency Department (HOSPITAL_COMMUNITY): Payer: Managed Care, Other (non HMO)

## 2017-10-11 DIAGNOSIS — R1084 Generalized abdominal pain: Secondary | ICD-10-CM | POA: Diagnosis not present

## 2017-10-11 DIAGNOSIS — F17210 Nicotine dependence, cigarettes, uncomplicated: Secondary | ICD-10-CM | POA: Diagnosis not present

## 2017-10-11 DIAGNOSIS — R112 Nausea with vomiting, unspecified: Secondary | ICD-10-CM | POA: Insufficient documentation

## 2017-10-11 DIAGNOSIS — R197 Diarrhea, unspecified: Secondary | ICD-10-CM | POA: Diagnosis not present

## 2017-10-11 LAB — COMPREHENSIVE METABOLIC PANEL
ALT: 42 U/L (ref 0–44)
AST: 31 U/L (ref 15–41)
Albumin: 4.7 g/dL (ref 3.5–5.0)
Alkaline Phosphatase: 53 U/L (ref 38–126)
Anion gap: 10 (ref 5–15)
BILIRUBIN TOTAL: 0.9 mg/dL (ref 0.3–1.2)
BUN: 9 mg/dL (ref 6–20)
CHLORIDE: 105 mmol/L (ref 98–111)
CO2: 27 mmol/L (ref 22–32)
Calcium: 10.1 mg/dL (ref 8.9–10.3)
Creatinine, Ser: 1.39 mg/dL — ABNORMAL HIGH (ref 0.61–1.24)
Glucose, Bld: 107 mg/dL — ABNORMAL HIGH (ref 70–99)
Potassium: 4.3 mmol/L (ref 3.5–5.1)
Sodium: 142 mmol/L (ref 135–145)
TOTAL PROTEIN: 8.2 g/dL — AB (ref 6.5–8.1)

## 2017-10-11 LAB — URINALYSIS, ROUTINE W REFLEX MICROSCOPIC
Bilirubin Urine: NEGATIVE
Glucose, UA: NEGATIVE mg/dL
Hgb urine dipstick: NEGATIVE
KETONES UR: NEGATIVE mg/dL
LEUKOCYTES UA: NEGATIVE
NITRITE: NEGATIVE
Protein, ur: NEGATIVE mg/dL
Specific Gravity, Urine: 1.021 (ref 1.005–1.030)
pH: 7 (ref 5.0–8.0)

## 2017-10-11 LAB — CBC WITH DIFFERENTIAL/PLATELET
BASOS ABS: 0 10*3/uL (ref 0.0–0.1)
Basophils Relative: 0 %
EOS ABS: 0 10*3/uL (ref 0.0–0.7)
EOS PCT: 0 %
HCT: 41.8 % (ref 39.0–52.0)
HEMOGLOBIN: 14 g/dL (ref 13.0–17.0)
Lymphocytes Relative: 25 %
Lymphs Abs: 2.4 10*3/uL (ref 0.7–4.0)
MCH: 28.3 pg (ref 26.0–34.0)
MCHC: 33.5 g/dL (ref 30.0–36.0)
MCV: 84.4 fL (ref 78.0–100.0)
Monocytes Absolute: 0.8 10*3/uL (ref 0.1–1.0)
Monocytes Relative: 8 %
NEUTROS PCT: 67 %
Neutro Abs: 6.4 10*3/uL (ref 1.7–7.7)
PLATELETS: 290 10*3/uL (ref 150–400)
RBC: 4.95 MIL/uL (ref 4.22–5.81)
RDW: 15 % (ref 11.5–15.5)
WBC: 9.6 10*3/uL (ref 4.0–10.5)

## 2017-10-11 LAB — LIPASE, BLOOD: LIPASE: 26 U/L (ref 11–51)

## 2017-10-11 MED ORDER — GI COCKTAIL ~~LOC~~
30.0000 mL | Freq: Once | ORAL | Status: AC
  Administered 2017-10-11: 30 mL via ORAL
  Filled 2017-10-11: qty 30

## 2017-10-11 MED ORDER — DICYCLOMINE HCL 10 MG/ML IM SOLN
20.0000 mg | Freq: Once | INTRAMUSCULAR | Status: AC
  Administered 2017-10-11: 20 mg via INTRAMUSCULAR
  Filled 2017-10-11: qty 2

## 2017-10-11 MED ORDER — FAMOTIDINE IN NACL 20-0.9 MG/50ML-% IV SOLN
20.0000 mg | Freq: Once | INTRAVENOUS | Status: AC
  Administered 2017-10-11: 20 mg via INTRAVENOUS
  Filled 2017-10-11: qty 50

## 2017-10-11 MED ORDER — ONDANSETRON 4 MG PO TBDP: 4.0000 mg | ORAL_TABLET | Freq: Three times a day (TID) | ORAL | 0 refills | Status: DC | PRN

## 2017-10-11 MED ORDER — DICYCLOMINE HCL 20 MG PO TABS: 20.0000 mg | ORAL_TABLET | Freq: Two times a day (BID) | ORAL | 0 refills | Status: DC

## 2017-10-11 MED ORDER — ONDANSETRON HCL 4 MG/2ML IJ SOLN
4.0000 mg | Freq: Once | INTRAMUSCULAR | Status: AC
  Administered 2017-10-11: 4 mg via INTRAVENOUS
  Filled 2017-10-11: qty 2

## 2017-10-11 MED ORDER — SODIUM CHLORIDE 0.9 % IV BOLUS
1000.0000 mL | Freq: Once | INTRAVENOUS | Status: AC
  Administered 2017-10-11: 1000 mL via INTRAVENOUS

## 2017-10-11 NOTE — ED Notes (Signed)
Patient is aware a urine sample is needed.  

## 2017-10-11 NOTE — ED Provider Notes (Signed)
06:45: Assumed care from Demetrios Loll PA-C at change of shift pending urinalysis and completely of fluids.   Please see prior provider note for full H&P. Briefly patient is a 27 year old male who presented to the ED with complaints of N/V/D and generalized abdominal discomfort x 2 days.   Physical Exam  BP (!) 133/108   Pulse 65   Temp 98.8 F (37.1 C) (Oral)   Resp 18   Ht 6\' 4"  (1.93 m)   Wt (!) 154.2 kg (340 lb)   SpO2 100%   BMI 41.39 kg/m   Physical Exam  Constitutional: He appears well-developed and well-nourished. No distress.  HENT:  Head: Normocephalic and atraumatic.  Eyes: Conjunctivae are normal. Right eye exhibits no discharge. Left eye exhibits no discharge.  Abdominal: Soft. He exhibits no distension. There is no tenderness. There is no rigidity, no rebound and no guarding.  Neurological: He is alert.  Clear speech.   Psychiatric: He has a normal mood and affect. His behavior is normal. Thought content normal.  Nursing note and vitals reviewed.   ED Course/Procedures    Results for orders placed or performed during the hospital encounter of 10/11/17  Comprehensive metabolic panel  Result Value Ref Range   Sodium 142 135 - 145 mmol/L   Potassium 4.3 3.5 - 5.1 mmol/L   Chloride 105 98 - 111 mmol/L   CO2 27 22 - 32 mmol/L   Glucose, Bld 107 (H) 70 - 99 mg/dL   BUN 9 6 - 20 mg/dL   Creatinine, Ser 1.61 (H) 0.61 - 1.24 mg/dL   Calcium 09.6 8.9 - 04.5 mg/dL   Total Protein 8.2 (H) 6.5 - 8.1 g/dL   Albumin 4.7 3.5 - 5.0 g/dL   AST 31 15 - 41 U/L   ALT 42 0 - 44 U/L   Alkaline Phosphatase 53 38 - 126 U/L   Total Bilirubin 0.9 0.3 - 1.2 mg/dL   GFR calc non Af Amer >60 >60 mL/min   GFR calc Af Amer >60 >60 mL/min   Anion gap 10 5 - 15  CBC with Differential  Result Value Ref Range   WBC 9.6 4.0 - 10.5 K/uL   RBC 4.95 4.22 - 5.81 MIL/uL   Hemoglobin 14.0 13.0 - 17.0 g/dL   HCT 40.9 81.1 - 91.4 %   MCV 84.4 78.0 - 100.0 fL   MCH 28.3 26.0 - 34.0 pg   MCHC 33.5 30.0 - 36.0 g/dL   RDW 78.2 95.6 - 21.3 %   Platelets 290 150 - 400 K/uL   Neutrophils Relative % 67 %   Neutro Abs 6.4 1.7 - 7.7 K/uL   Lymphocytes Relative 25 %   Lymphs Abs 2.4 0.7 - 4.0 K/uL   Monocytes Relative 8 %   Monocytes Absolute 0.8 0.1 - 1.0 K/uL   Eosinophils Relative 0 %   Eosinophils Absolute 0.0 0.0 - 0.7 K/uL   Basophils Relative 0 %   Basophils Absolute 0.0 0.0 - 0.1 K/uL  Lipase, blood  Result Value Ref Range   Lipase 26 11 - 51 U/L  Urinalysis, Routine w reflex microscopic  Result Value Ref Range   Color, Urine YELLOW YELLOW   APPearance CLEAR CLEAR   Specific Gravity, Urine 1.021 1.005 - 1.030   pH 7.0 5.0 - 8.0   Glucose, UA NEGATIVE NEGATIVE mg/dL   Hgb urine dipstick NEGATIVE NEGATIVE   Bilirubin Urine NEGATIVE NEGATIVE   Ketones, ur NEGATIVE NEGATIVE mg/dL  Protein, ur NEGATIVE NEGATIVE mg/dL   Nitrite NEGATIVE NEGATIVE   Leukocytes, UA NEGATIVE NEGATIVE   Dg Abdomen 1 View  Result Date: 10/11/2017 CLINICAL DATA:  Mid abdominal pain and nausea. EXAM: ABDOMEN - 1 VIEW COMPARISON:  None. FINDINGS: Divided AP views of the abdomen. No bowel dilatation to suggest obstruction. Air within normal caliber stomach and colon. No evidence of free air. Lung bases are clear. More detailed evaluation is limited given soft tissue attenuation from body habitus. IMPRESSION: No evidence of bowel obstruction. Electronically Signed   By: Rubye OaksMelanie  Ehinger M.D.   On: 10/11/2017 05:15    Procedures  MDM   Patient presents to the emergency department with N/V/D and abdominal pain.  Work-up reviewed, elevated creatinine appears at baseline, otherwise unremarkable.  No leukocytosis.  No anemia.  No significant elect light abnormalities.  Urinalysis is completely normal, no signs of infection.  Abdominal x-ray is without evidence of bowel obstruction.  On reevaluation patient is resting comfortably, abdomen is nontender, no peritoneal signs, he has no complaints at  this time and feels ready to go home.  Discharge instructions/prescription medications per previous provider. I discussed results, treatment plan, need for PCP follow-up, and return precautions with the patient. Provided opportunity for questions, patient confirmed understanding and is in agreement with plan.         Cherly Andersonetrucelli, Lenus Trauger R, PA-C 10/11/17 0827    Derwood KaplanNanavati, Ankit, MD 10/11/17 [phone]

## 2017-10-11 NOTE — ED Triage Notes (Signed)
Pt arriving POV with N/V since Monday night. Pt reports not taking any medications at home because he could not keep anything down.

## 2017-10-11 NOTE — Discharge Instructions (Signed)
Your abdominal pain is likely from gastritis, reflux or a stomach ulcer. You will need to take the prescribed proton pump inhibitor as directed, and avoid spicy/fatty/acidic foods. Avoid laying down flat within 30 minutes of eating. Avoid NSAIDs like ibuprofen or Aleve on an empty stomach. Use zofran as needed for nausea. Follow up with the gastroenterologist (GI doctor) listed for ongoing evaluation of your abdominal pain. Return to the ER for new or worsening symptoms, any additional concers. ° ° °SEEK IMMEDIATE MEDICAL ATTENTION IF YOU DEVELOP ANY OF THE FOLLOWING SYMPTOMS: °The pain does not go away or becomes severe.  °A temperature above 101 develops.  °Repeated vomiting occurs (multiple episodes).  °Blood is being passed in stools or vomit (bright red or black tarry stools).  °Return also if you develop chest pain, difficulty breathing, dizziness or fainting ° °

## 2017-10-11 NOTE — ED Provider Notes (Signed)
[facility]-EMERGENCY DEPT Provider Note   CSN: [id number] Arrival date & time: 10/11/17  0103     History   Chief Complaint Chief Complaint  Patient presents with  . Nausea  . Emesis    HPI Earl Mccarthy is a 27 y.o. male.  HPI 27 year old AA male with no pertinent past medical history presents to the emergency department today for evaluation of nausea, vomiting, diarrhea and generalized abdominal pain.  Symptoms started 2 days ago.  No known sick contacts.  Started after eating a [employer] frappe and apple pie.  Patient reports several episodes of nonbloody bilious emesis along with nonbloody diarrhea.  Reports generalized abdominal pain localized in his epigastric region.  Reports some intermittent dizziness and believes he may be dehydrated.  Patient has not been able to take anything for his symptoms.  Difficult keeping p.o. fluids down.  Denies any urinary symptoms.  Denies any fevers, recent travel or recent antibiotic use.  Nothing makes her symptoms better or worse.  Patient describes the pain is cramping in nature and nonradiating.  Pt denies any fever, chill, ha, vision changes, lightheadedness, dizziness, congestion, neck pain, cp, sob, cough, urinary symptoms,melena, hematochezia, lower extremity paresthesias.  History reviewed. No pertinent past medical history.  Patient Active Problem List   Diagnosis Date Noted  . MDD (major depressive disorder), single episode, severe ([facility]) 09/22/2012    No past surgical history on file.      Home Medications    Prior to Admission medications   Not on File    Family History No family history on file.  Social History Social History   Tobacco Use  . Smoking status: Current Every Day Smoker    Packs/day: 0.25    Years: 0.50    Pack years: 0.12    Types: Cigarettes  Substance Use Topics  . Alcohol use: Yes  . Drug use: No    Comment: sometimes-socially "a few shots"      Allergies   Bee  venom and Strawberry extract   Review of Systems Review of Systems  All other systems reviewed and are negative.    Physical Exam Updated Vital Signs BP (!) 155/93   Pulse 60   Temp 98.8 F (37.1 C) (Oral)   Resp 15   Ht 6\' 4"  (1.93 m)   Wt (!) 154.2 kg (340 lb)   SpO2 100%   BMI 41.39 kg/m   Physical Exam  Constitutional: He is oriented to person, place, and time. He appears well-developed and well-nourished.  Non-toxic appearance. No distress.  HENT:  Head: Normocephalic and atraumatic.  Mouth/Throat: Oropharynx is clear and moist.  Eyes: Pupils are equal, round, and reactive to light. Conjunctivae are normal. Right eye exhibits no discharge. Left eye exhibits no discharge.  Neck: Normal range of motion. Neck supple.  Cardiovascular: Normal rate, regular rhythm, normal heart sounds and intact distal pulses. Exam reveals no gallop and no friction rub.  No murmur heard. Pulmonary/Chest: Effort normal and breath sounds normal. No stridor. No respiratory distress. He has no wheezes. He has no rales. He exhibits no tenderness.  Abdominal: Soft. Normal appearance. He exhibits no distension. Bowel sounds are increased. There is generalized tenderness and tenderness in the epigastric area. There is no rigidity, no rebound, no guarding, no CVA tenderness, no tenderness at McBurney's point and negative Murphy's sign.  Musculoskeletal: Normal range of motion. He exhibits no tenderness.  Lymphadenopathy:    He has no cervical adenopathy.  Neurological: He is  alert and oriented to person, place, and time.  Skin: Skin is warm and dry. Capillary refill takes less than 2 seconds. No rash noted.  Psychiatric: His behavior is normal. Judgment and thought content normal.  Nursing note and vitals reviewed.    ED Treatments / Results  Labs (all labs ordered are listed, but only abnormal results are displayed) Labs Reviewed  COMPREHENSIVE METABOLIC PANEL - Abnormal; Notable for the  following components:      Result Value   Glucose, Bld 107 (*)    Creatinine, Ser 1.39 (*)    Total Protein 8.2 (*)    All other components within normal limits  CBC WITH DIFFERENTIAL/PLATELET  LIPASE, BLOOD  URINALYSIS, ROUTINE W REFLEX MICROSCOPIC    EKG None  Radiology Dg Abdomen 1 View  Result Date: 10/11/2017 CLINICAL DATA:  Mid abdominal pain and nausea. EXAM: ABDOMEN - 1 VIEW COMPARISON:  None. FINDINGS: Divided AP views of the abdomen. No bowel dilatation to suggest obstruction. Air within normal caliber stomach and colon. No evidence of free air. Lung bases are clear. More detailed evaluation is limited given soft tissue attenuation from body habitus. IMPRESSION: No evidence of bowel obstruction. Electronically Signed   By: Rubye Oaks M.D.   On: 10/11/2017 05:15    Procedures Procedures (including critical care time)  Medications Ordered in ED Medications  sodium chloride 0.9 % bolus 1,000 mL (1,000 mLs Intravenous New Bag/Given 10/11/17 0530)  sodium chloride 0.9 % bolus 1,000 mL (has no administration in time range)  ondansetron (ZOFRAN) injection 4 mg (4 mg Intravenous Given 10/11/17 0530)  famotidine (PEPCID) IVPB 20 mg premix (0 mg Intravenous Stopped 10/11/17 0612)  gi cocktail (Maalox,Lidocaine,Donnatal) (30 mLs Oral Given 10/11/17 0629)  dicyclomine (BENTYL) injection 20 mg (20 mg Intramuscular Given 10/11/17 0524)     Initial Impression / Assessment and Plan / ED Course  I have reviewed the triage vital signs and the nursing notes.  Pertinent labs & imaging results that were available during my care of the patient were reviewed by me and considered in my medical decision making (see chart for details).     Patient presents the ED for evaluation of abdominal pain, nausea, vomiting and diarrhea.  Vital signs reassuring in the emergency department.  Patient afebrile.  No tachycardia or hypotension is noted.  Patient is mildly hypertensive with history of same  and this was discussed with patient that needs close follow-up with a primary care doctor.  On exam bowel sounds are increased in all 4 quadrants.  Patient has no peritoneal signs.  Does have some generalized pain to palpation of the abdomen with some more focalized pain to the epigastric region.  No CVA tenderness.  Heart regular rate and rhythm.  Lungs clear to auscultation bilaterally.  Mucous membranes were moist.  Lab work reassuring.  No leukocytosis.  No significant elect light derangement.  Mild elevation in creatinine of 1.39 but appears similar to prior.  Liver enzymes are normal.  Lipase was normal.  UA is pending.  X-ray of abdomen shows nonobstructive bowel gas pattern.  Patient treated with GI cocktail, Pepcid, fluids and Zofran.  Significant improvement in his symptoms.  Repeat exam shows no signs of focal abdominal pain.  Low suspicion for appendicitis, diverticulitis, UTI, pyelonephritis, obstruction, pancreatitis, cholecystitis at this time.  No indication for further advanced imaging at this time as this was discussed with patient and would like to avoid.  If symptoms persist patient may need to return the ED  for further evaluation and imaging.  Will wait for fluids to finish and UA to result.  Suspect patient's symptoms are secondary to gastroenteritis.  Will provide Zofran and Bentyl for home use.  Encourage plenty of fluids.  Discussed reasons to return the ED.  Pt does not meet Sirs or sepsis criteria.  Care handoff to PAPetrucelli. Pt has pending at this time ua and reassessement.  Disposition likely home pending lab and test results. Care dicussed and plan agreed upon with oncoming PA. Pt updated on plan of care and is currently hemodynamically stable at this time with normal vs.     Final Clinical Impressions(s) / ED Diagnoses   Final diagnoses:  Nausea vomiting and diarrhea  Generalized abdominal pain    ED Discharge Orders        Ordered    ondansetron (ZOFRAN ODT) 4 MG  disintegrating tablet  Every 8 hours PRN     10/11/17 0635    dicyclomine (BENTYL) 20 MG tablet  2 times daily     10/11/17 0635       Rise MuLeaphart, Jaleea Alesi T, PA-C 10/11/17 0636    Derwood KaplanNanavati, Ankit, MD 10/11/17 [phone]

## 2019-01-16 ENCOUNTER — Other Ambulatory Visit: Payer: Self-pay

## 2019-01-16 ENCOUNTER — Ambulatory Visit (INDEPENDENT_AMBULATORY_CARE_PROVIDER_SITE_OTHER)
Admission: EM | Admit: 2019-01-16 | Discharge: 2019-01-16 | Disposition: A | Discharge status: Home / Self Care | Payer: BC Managed Care – PPO | Source: Home / Self Care | Attending: Family Medicine | Admitting: Family Medicine

## 2019-01-16 ENCOUNTER — Emergency Department (HOSPITAL_COMMUNITY)
Admission: EM | Admit: 2019-01-16 | Discharge: 2019-01-17 | Disposition: A | Discharge status: Home / Self Care | Payer: BC Managed Care – PPO | Attending: Emergency Medicine | Admitting: Emergency Medicine

## 2019-01-16 ENCOUNTER — Encounter (HOSPITAL_COMMUNITY): Payer: Self-pay | Admitting: Emergency Medicine

## 2019-01-16 ENCOUNTER — Ambulatory Visit (INDEPENDENT_AMBULATORY_CARE_PROVIDER_SITE_OTHER): Payer: BC Managed Care – PPO

## 2019-01-16 DIAGNOSIS — K5792 Diverticulitis of intestine, part unspecified, without perforation or abscess without bleeding: Secondary | ICD-10-CM

## 2019-01-16 DIAGNOSIS — K5900 Constipation, unspecified: Secondary | ICD-10-CM | POA: Diagnosis not present

## 2019-01-16 DIAGNOSIS — Z87891 Personal history of nicotine dependence: Secondary | ICD-10-CM | POA: Insufficient documentation

## 2019-01-16 DIAGNOSIS — R1030 Lower abdominal pain, unspecified: Secondary | ICD-10-CM

## 2019-01-16 DIAGNOSIS — R140 Abdominal distension (gaseous): Secondary | ICD-10-CM | POA: Diagnosis not present

## 2019-01-16 LAB — URINALYSIS, ROUTINE W REFLEX MICROSCOPIC
Bilirubin Urine: NEGATIVE
Glucose, UA: NEGATIVE mg/dL
Hgb urine dipstick: NEGATIVE
Ketones, ur: NEGATIVE mg/dL
Leukocytes,Ua: NEGATIVE
Nitrite: NEGATIVE
Protein, ur: NEGATIVE mg/dL
Specific Gravity, Urine: 1.025 (ref 1.005–1.030)
pH: 7 (ref 5.0–8.0)

## 2019-01-16 LAB — LIPASE, BLOOD: Lipase: 27 U/L (ref 11–51)

## 2019-01-16 LAB — CBC
HCT: 44.9 % (ref 39.0–52.0)
Hemoglobin: 14.3 g/dL (ref 13.0–17.0)
MCH: 27.7 pg (ref 26.0–34.0)
MCHC: 31.8 g/dL (ref 30.0–36.0)
MCV: 87 fL (ref 80.0–100.0)
Platelets: 215 10*3/uL (ref 150–400)
RBC: 5.16 MIL/uL (ref 4.22–5.81)
RDW: 14.5 % (ref 11.5–15.5)
WBC: 14.7 10*3/uL — ABNORMAL HIGH (ref 4.0–10.5)
nRBC: 0 % (ref 0.0–0.2)

## 2019-01-16 LAB — COMPREHENSIVE METABOLIC PANEL
ALT: 34 U/L (ref 0–44)
AST: 43 U/L — ABNORMAL HIGH (ref 15–41)
Albumin: 4.4 g/dL (ref 3.5–5.0)
Alkaline Phosphatase: 69 U/L (ref 38–126)
Anion gap: 12 (ref 5–15)
BUN: 9 mg/dL (ref 6–20)
CO2: 22 mmol/L (ref 22–32)
Calcium: 9.2 mg/dL (ref 8.9–10.3)
Chloride: 105 mmol/L (ref 98–111)
Creatinine, Ser: 1.41 mg/dL — ABNORMAL HIGH (ref 0.61–1.24)
GFR calc Af Amer: >60 mL/min (ref >60)
GFR calc non Af Amer: >60 mL/min (ref >60)
Glucose, Bld: 94 mg/dL (ref 70–99)
Potassium: 5.3 mmol/L — ABNORMAL HIGH (ref 3.5–5.1)
Sodium: 139 mmol/L (ref 135–145)
Total Bilirubin: 2.1 mg/dL — ABNORMAL HIGH (ref 0.3–1.2)
Total Protein: 7.4 g/dL (ref 6.5–8.1)

## 2019-01-16 MED ORDER — SODIUM CHLORIDE 0.9% FLUSH: 3.0000 mL | Freq: Once | INTRAVENOUS | Status: DC

## 2019-01-16 NOTE — ED Triage Notes (Signed)
Abdominal pain that started yesterday during the night, pain woke patient from sleep.  Patient has had nausea today.  Patient took a laxative today and still has no relief

## 2019-01-16 NOTE — ED Provider Notes (Signed)
[facility] EMERGENCY DEPARTMENT Provider Note   CSN: [id number] Arrival date & time: 01/16/19  1813     History   Chief Complaint Chief Complaint  Patient presents with  . Abdominal Pain    HPI Earl Mccarthy is a 28 y.o. male.     The history is provided by the patient and medical records.  Abdominal Pain Associated symptoms: constipation      28 y.o. M with history of MDD, presenting to the ED for lower abdominal pain.  Patient reports for the past 4 days he has had left lower abdominal pain, sensation of bloating and fullness in his abdomen.  States he has had a very hard time having a bowel movement, he was able to produce a very small amount this morning but had to force it over about 30 minutes.  States normally he has 3-4 bowel movements a day.  He states he cannot pass flatus.  He has not had any urinary symptoms.  No fever or chills.  No vomiting but has had decreased appetite.  He has never had issues like this before.  No prior abdominal surgeries.  No medication tried prior to arrival.  He was seen at urgent care and had abdominal films that were normal.  History reviewed. No pertinent past medical history.  Patient Active Problem List   Diagnosis Date Noted  . MDD (major depressive disorder), single episode, severe ([place]) 09/22/2012    History reviewed. No pertinent surgical history.      Home Medications    Prior to Admission medications   Medication Sig Start Date End Date Taking? Authorizing Provider  dicyclomine (BENTYL) 20 MG tablet Take 1 tablet (20 mg total) by mouth 2 (two) times daily. 10/11/17 01/16/19  Doristine Devoid, PA-C    Family History Family History  Problem Relation Age of Onset  . Cancer Mother   . Hypertension Father     Social History Social History   Tobacco Use  . Smoking status: Former Smoker    Packs/day: 0.25    Years: 0.50    Pack years: 0.12    Types: Cigarettes  . Smokeless tobacco: Never Used   Substance Use Topics  . Alcohol use: Yes  . Drug use: Not Currently    Comment: sometimes-socially "a few shots"      Allergies   Bee venom and Strawberry extract   Review of Systems Review of Systems  Gastrointestinal: Positive for abdominal pain and constipation.  All other systems reviewed and are negative.    Physical Exam Updated Vital Signs BP 133/79 (BP Location: Right Arm)   Pulse 98   Temp 98.3 F (36.8 C) (Oral)   Resp 16   SpO2 98%   Physical Exam Vitals signs and nursing note reviewed.  Constitutional:      Appearance: He is well-developed.  HENT:     Head: Normocephalic and atraumatic.  Eyes:     Conjunctiva/sclera: Conjunctivae normal.     Pupils: Pupils are equal, round, and reactive to light.  Neck:     Musculoskeletal: Normal range of motion.  Cardiovascular:     Rate and Rhythm: Normal rate and regular rhythm.     Heart sounds: Normal heart sounds.  Pulmonary:     Effort: Pulmonary effort is normal.     Breath sounds: Normal breath sounds.  Abdominal:     General: Bowel sounds are decreased.     Palpations: Abdomen is soft.     Tenderness: There  is abdominal tenderness in the left lower quadrant. There is no guarding or rebound.     Hernia: No hernia is present.       Comments: Bowel sounds decreased, no apparent distention, tender in LLQ as depicted  Musculoskeletal: Normal range of motion.  Skin:    General: Skin is warm and dry.  Neurological:     Mental Status: He is alert and oriented to person, place, and time.      ED Treatments / Results  Labs (all labs ordered are listed, but only abnormal results are displayed) Labs Reviewed  COMPREHENSIVE METABOLIC PANEL - Abnormal; Notable for the following components:      Result Value   Potassium 5.3 (*)    Creatinine, Ser 1.41 (*)    AST 43 (*)    Total Bilirubin 2.1 (*)    All other components within normal limits  CBC - Abnormal; Notable for the following components:   WBC  14.7 (*)    All other components within normal limits  LIPASE, BLOOD  URINALYSIS, ROUTINE W REFLEX MICROSCOPIC    EKG None  Radiology Dg Abd 2 Views  Result Date: 01/16/2019 CLINICAL DATA:  Lower abdominal pain for 2 days EXAM: ABDOMEN - 2 VIEW COMPARISON:  10/11/2017 FINDINGS: The bowel gas pattern is normal. There is no evidence of free air. No radio-opaque calculi or other significant radiographic abnormality is seen. IMPRESSION: Negative. Electronically Signed   By: Signa Kellaylor  Stroud M.D.   On: 01/16/2019 17:53    Procedures Procedures (including critical care time)  Medications Ordered in ED Medications  sodium chloride flush (NS) 0.9 % injection 3 mL (has no administration in time range)  iohexol (OMNIPAQUE) 300 MG/ML solution 100 mL (100 mLs Intravenous Contrast Given 01/17/19 0015)  ciprofloxacin (CIPRO) tablet 500 mg (500 mg Oral Given 01/17/19 0043)  metroNIDAZOLE (FLAGYL) tablet 500 mg (500 mg Oral Given 01/17/19 0043)  oxyCODONE-acetaminophen (PERCOCET/ROXICET) 5-325 MG per tablet 2 tablet (2 tablets Oral Given 01/17/19 0043)     Initial Impression / Assessment and Plan / ED Course  I have reviewed the triage vital signs and the nursing notes.  Pertinent labs & imaging results that were available during my care of the patient were reviewed by me and considered in my medical decision making (see chart for details).  28 year old male presenting to the ED for lower abdominal pain and change in bowel habits over the past 4 days.  He is afebrile and nontoxic.  Does have some focal tenderness in the left lower abdomen.  Had plain films at urgent care that did not show any acute findings.  Lab work here is grossly reassuring, slight hemolysis noted with potassium of 5.3.  CT scan was obtained revealing uncomplicated diverticulitis without abscess or perforation.  Patient remains hemodynamically stable here without any signs of toxicity.  Will start on course of Cipro/Flagyl as well as  pain and nausea medication.  Encouraged to use Colace, Dulcolax, or other over-the-counter stool softener to help regulate bowel movements if needed.  Follow-up with PCP.  Return here for any new or acute changes.  Final Clinical Impressions(s) / ED Diagnoses   Final diagnoses:  Diverticulitis    ED Discharge Orders         Ordered    oxyCODONE-acetaminophen (PERCOCET) 5-325 MG tablet  Every 4 hours PRN     01/17/19 0039    ciprofloxacin (CIPRO) 500 MG tablet  Every 12 hours     01/17/19 0039  metroNIDAZOLE (FLAGYL) 500 MG tablet  2 times daily     01/17/19 0039    ondansetron (ZOFRAN ODT) 4 MG disintegrating tablet  Every 8 hours PRN     01/17/19 0039           Garlon Hatchet, PA-C 01/17/19 0051    Nira Conn, MD 01/17/19 2315

## 2019-01-16 NOTE — ED Triage Notes (Signed)
Pt with constipation, lack of appetite and abdominal pain. UC is sending him for possible bowel obstruction. DG abdomen collected at [facility]. No fever.

## 2019-01-16 NOTE — ED Provider Notes (Signed)
[facility]   [id number] 01/16/19 Arrival Time: 1602  ASSESSMENT & PLAN:  1. Lower abdominal pain     I have personally viewed the imaging studies ordered this visit. Question of a few air-fluid levels on supine film. Radiologist did not mention.  Very likely this could be constipation/obstipation. Less suspicious for early appendicitis or SBO. With level of pain described, he elects ED evaluation for further workup. Stable at discharge.   Reviewed expectations re: course of current medical issues. Questions answered. Outlined signs and symptoms indicating need for more acute intervention. Patient verbalized understanding. After Visit Summary given.   SUBJECTIVE: History from: patient. Earl Mccarthy is a 28 y.o. male who presents with complaint of fairly persistent non-radiating abdominal pain. Reports pain woke him from sleep early this morning. Initially generalized; now mostly lower left. Describes as dull/aching. Symptoms are gradually worsening since beginning. Fever: absent. Aggravating factors: have not been identified. Alleviating factors: have not been identified. OTC analgesics without relief. Associated symptoms: fatigue. He denies arthralgias, belching, diarrhea, dysuria and sweats. Appetite: decreased. PO intake: none today. Reports mild nausea without emesis. Ambulatory without assistance. Urinary symptoms: none. Bowel movements: are less frequent than before; last bowel movement this morning "but very small"; previous bowel movement 3-4 days ago; normally goes multiple times per day; bowel movements without blood. History of similar: no.  History reviewed. No pertinent surgical history.  ROS: As per HPI. All other systems negative.  OBJECTIVE:  Vitals:   01/16/19 1645  BP: (!) 158/95  Pulse: 82  Resp: (!) 24  Temp: 99.1 F (37.3 C)  TempSrc: Oral  SpO2: 100%    Recheck Resp: 18 and unlabored.  General appearance: alert, oriented, no acute  distress but does appear uncomfortable; supine on exam table HEENT: [place]; AT; oropharynx moist Lungs: clear to auscultation bilaterally; unlabored respirations Heart: regular rate and rhythm Abdomen: obese; soft; with mild distention; mild  and poorly localized tenderness to palpation over lower left abdomen; sparse bowel sounds; without appreciable masses or organomegaly; without guarding or rebound tenderness Back: without CVA tenderness; FROM at waist Extremities: without LE edema; symmetrical; without gross deformities Skin: warm and dry Neurologic: normal gait Psychological: alert and cooperative; normal mood and affect   No results found.   Allergies  Allergen Reactions  . Bee Venom Anaphylaxis  . Strawberry Extract Anaphylaxis   PMH: Depression.  Social History   Socioeconomic History  . Marital status: Single    Spouse name: Not on file  . Number of children: Not on file  . Years of education: Not on file  . Highest education level: Not on file  Occupational History  . Not on file  Social Needs  . Financial resource strain: Not on file  . Food insecurity    Worry: Not on file    Inability: Not on file  . Transportation needs    Medical: Not on file    Non-medical: Not on file  Tobacco Use  . Smoking status: Former Smoker    Packs/day: 0.25    Years: 0.50    Pack years: 0.12    Types: Cigarettes  Substance and Sexual Activity  . Alcohol use: Yes  . Drug use: Not Currently    Comment: sometimes-socially "a few shots"   . Sexual activity: Yes    Birth control/protection: Condom  Lifestyle  . Physical activity    Days per week: Not on file    Minutes per session: Not on file  . Stress: Not  on file  Relationships  . Social Herbalist on phone: Not on file    Gets together: Not on file    Attends religious service: Not on file    Active member of club or organization: Not on file    Attends meetings of clubs or organizations: Not on file     Relationship status: Not on file  . Intimate partner violence    Fear of current or ex partner: Not on file    Emotionally abused: Not on file    Physically abused: Not on file    Forced sexual activity: Not on file  Other Topics Concern  . Not on file  Social History Narrative  . Not on file   Family History  Problem Relation Age of Onset  . Cancer Mother   . Hypertension Father      Vanessa Kick, MD 01/16/19 [phone]

## 2019-01-17 ENCOUNTER — Emergency Department (HOSPITAL_COMMUNITY): Payer: BC Managed Care – PPO

## 2019-01-17 MED ORDER — METRONIDAZOLE 500 MG PO TABS
500.0000 mg | ORAL_TABLET | Freq: Once | ORAL | Status: AC
  Administered 2019-01-17: 500 mg via ORAL
  Filled 2019-01-17: qty 1

## 2019-01-17 MED ORDER — OXYCODONE-ACETAMINOPHEN 5-325 MG PO TABS: 1.0000 | ORAL_TABLET | ORAL | 0 refills | Status: DC | PRN

## 2019-01-17 MED ORDER — IOHEXOL 300 MG/ML  SOLN
100.0000 mL | Freq: Once | INTRAMUSCULAR | Status: AC | PRN
  Administered 2019-01-17: 100 mL via INTRAVENOUS

## 2019-01-17 MED ORDER — OXYCODONE-ACETAMINOPHEN 5-325 MG PO TABS
2.0000 | ORAL_TABLET | Freq: Once | ORAL | Status: AC
  Administered 2019-01-17: 2 via ORAL
  Filled 2019-01-17: qty 2

## 2019-01-17 MED ORDER — METRONIDAZOLE 500 MG PO TABS: 500.0000 mg | ORAL_TABLET | Freq: Two times a day (BID) | ORAL | 0 refills | Status: DC

## 2019-01-17 MED ORDER — CIPROFLOXACIN HCL 500 MG PO TABS: 500.0000 mg | ORAL_TABLET | Freq: Two times a day (BID) | ORAL | 0 refills | Status: DC

## 2019-01-17 MED ORDER — ONDANSETRON 4 MG PO TBDP: 4.0000 mg | ORAL_TABLET | Freq: Three times a day (TID) | ORAL | 0 refills | Status: DC | PRN

## 2019-01-17 MED ORDER — CIPROFLOXACIN HCL 500 MG PO TABS
500.0000 mg | ORAL_TABLET | Freq: Once | ORAL | Status: AC
  Administered 2019-01-17: 01:00:00 500 mg via ORAL
  Filled 2019-01-17: qty 1

## 2019-01-17 NOTE — Discharge Instructions (Signed)
CT today showed diverticulitis-- see attached for more information about this. Take the prescribed medication as directed.  Do not drive while taking pain medication. If bowel movements don't regulate, can take stool softener such as colace, dulcolax, etc. Follow-up with your primary care doctor. Return to the ED for new or worsening symptoms.

## 2019-01-17 NOTE — ED Notes (Signed)
Patient transported to CT 

## 2019-01-17 NOTE — ED Notes (Signed)
Patient verbalizes understanding of discharge instructions. Opportunity for questioning and answers were provided. Armband removed by staff, pt discharged from ED.  

## 2019-07-30 IMAGING — DX DG ABDOMEN 1V
3 series · 3 of 3 positions shown · non-contrast
Comparison: None.

CLINICAL DATA: Mid abdominal pain and nausea.

EXAM:
ABDOMEN - 1 VIEW

[abdomen kub (1 of 3)]
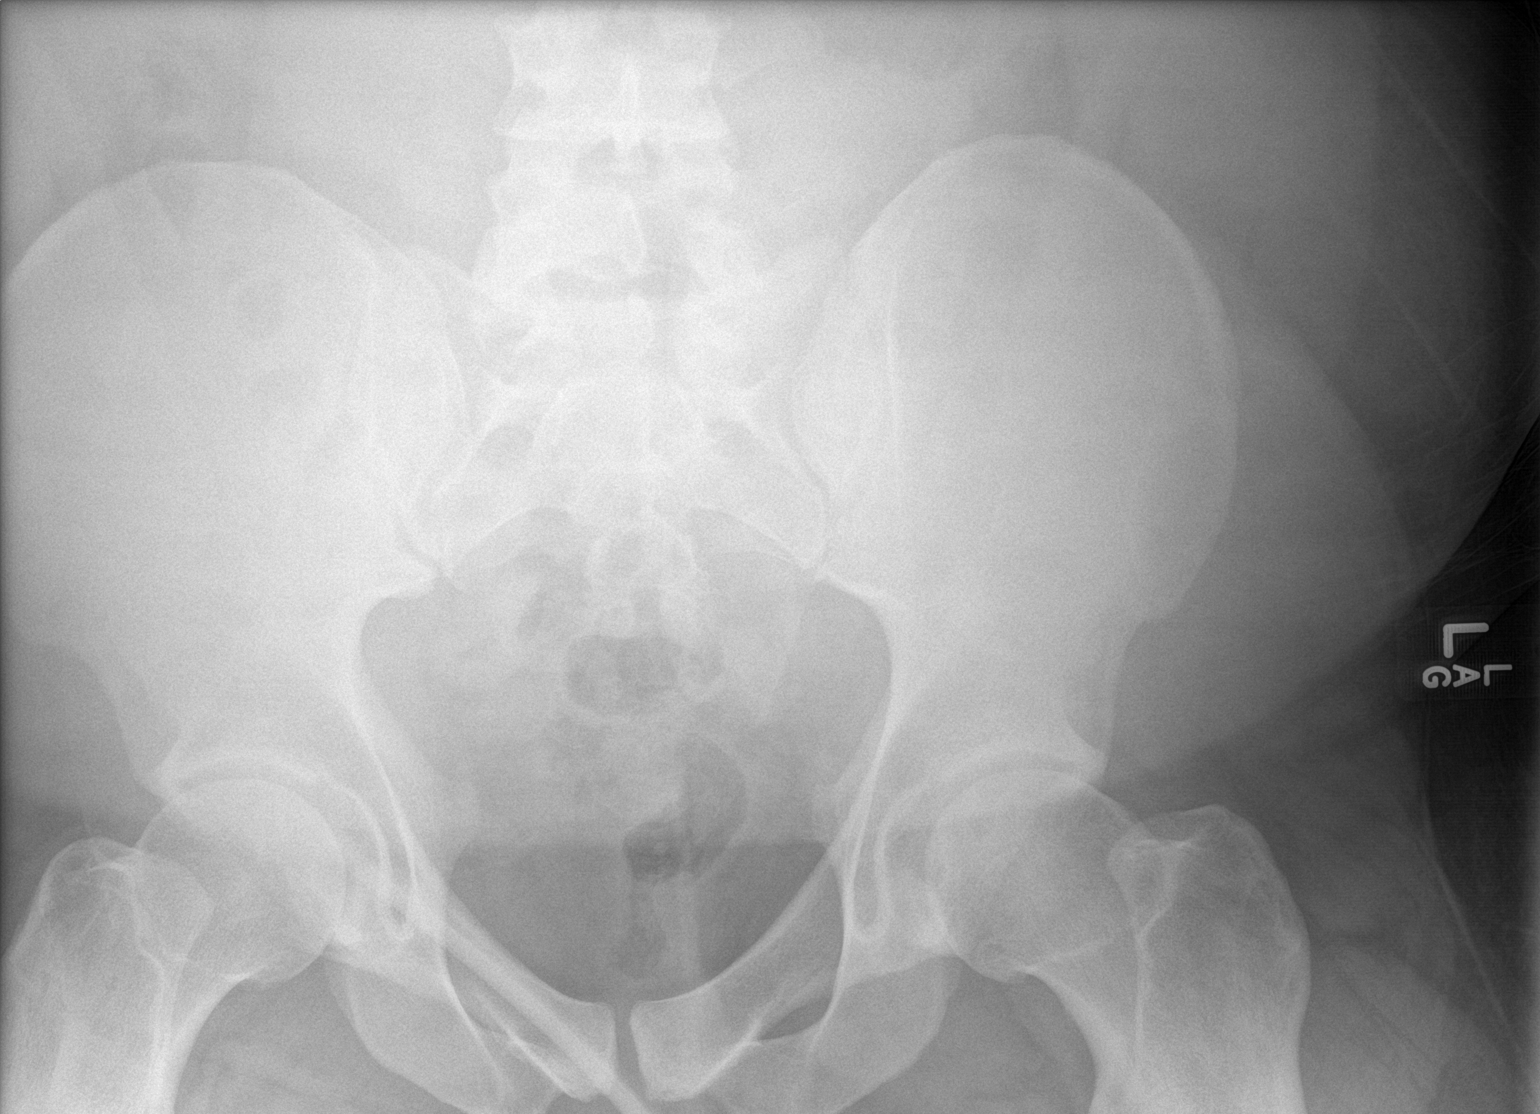

[abdomen kub (2 of 3)]
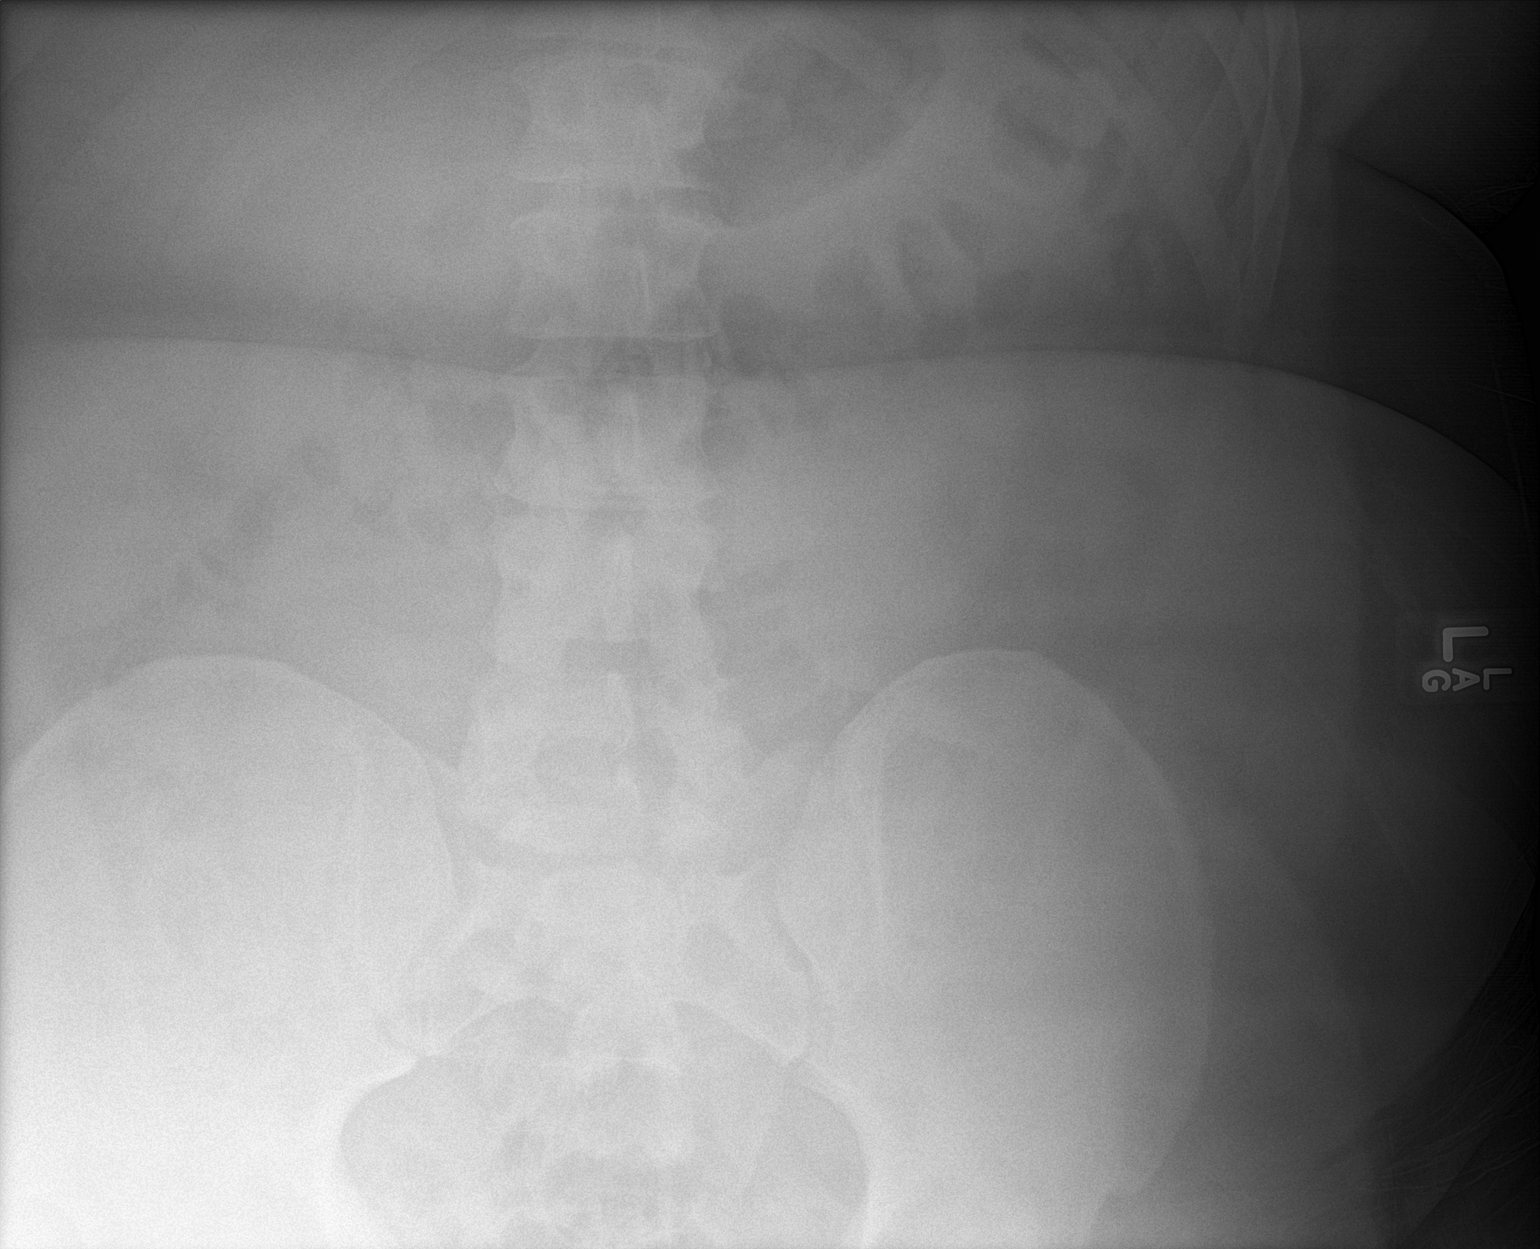

[abdomen kub (3 of 3)]
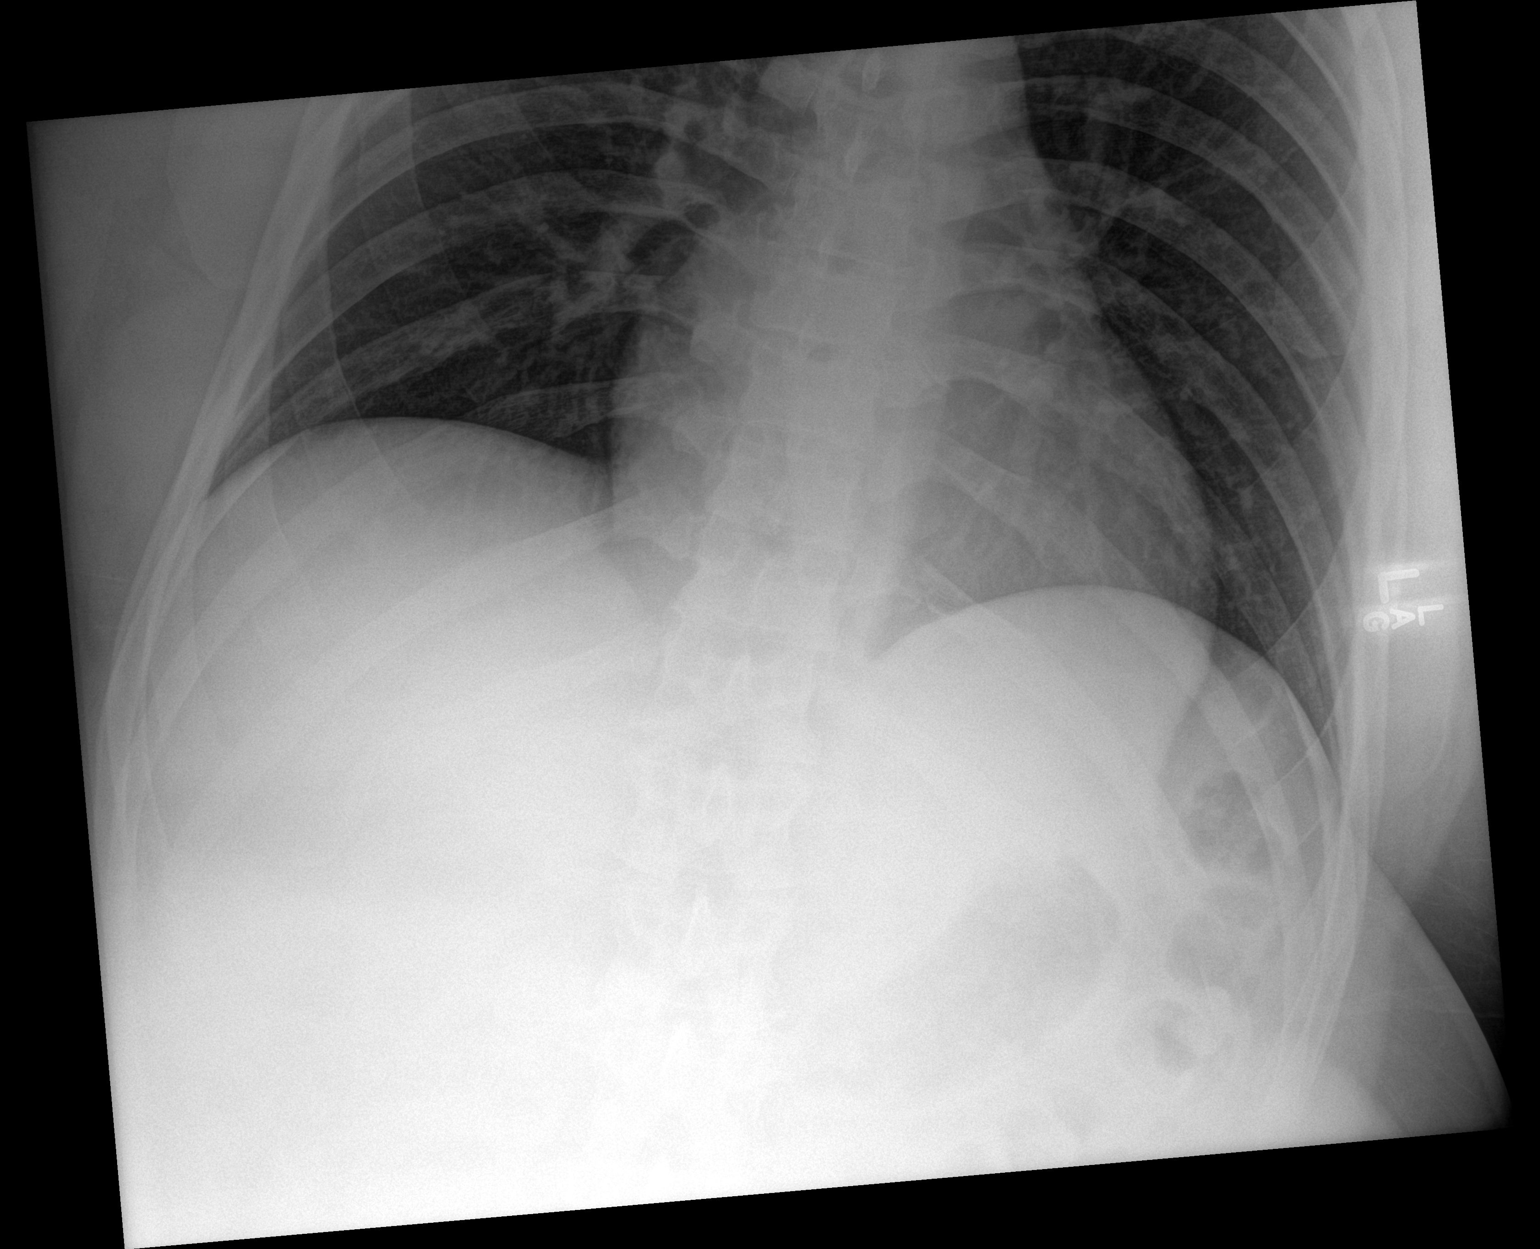

[3 of 3 positions shown; findings below may reference images not displayed]

FINDINGS: Divided AP views of the abdomen. No bowel dilatation to suggest
obstruction. Air within normal caliber stomach and colon. No
evidence of free air. Lung bases are clear. More detailed evaluation
is limited given soft tissue attenuation from body habitus.
IMPRESSION: No evidence of bowel obstruction.

## 2020-11-04 IMAGING — CT CT ABD-PELV W/ CM
2 of 4 series · 17 of 46 positions shown, 19 images · IV contrast (omnipaque)
Comparison: None.

CLINICAL DATA: Abduct bone distension, lower abdominal pain

EXAM:
CT ABDOMEN AND PELVIS WITH CONTRAST
TECHNIQUE: Multidetector CT imaging of the abdomen and pelvis was performed
using the standard protocol following bolus administration of
intravenous contrast.
CONTRAST:  100mL OMNIPAQUE IOHEXOL 300 MG/ML  SOLN

[Series 3: abdomen 5.0 · axial · 0.90mm/px · z∈[+887,+1357]mm · 14 of 106 slices shown, 16 images]
[im 6/106  soft-tissue]
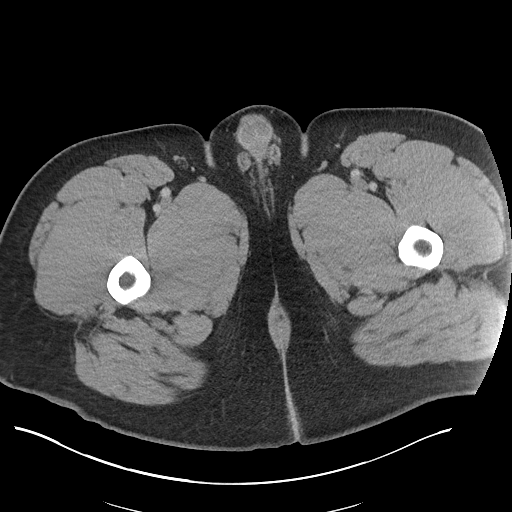
[im 6/106  bone]
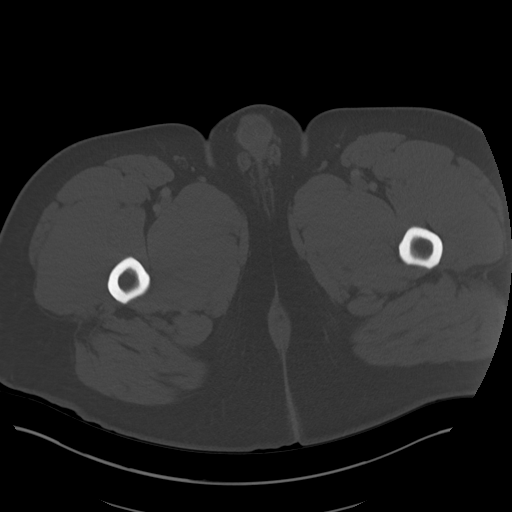
[im 12/106  soft-tissue]
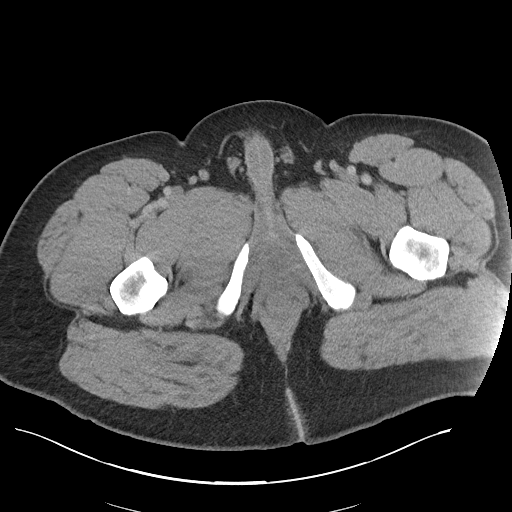
[im 24/106  soft-tissue]
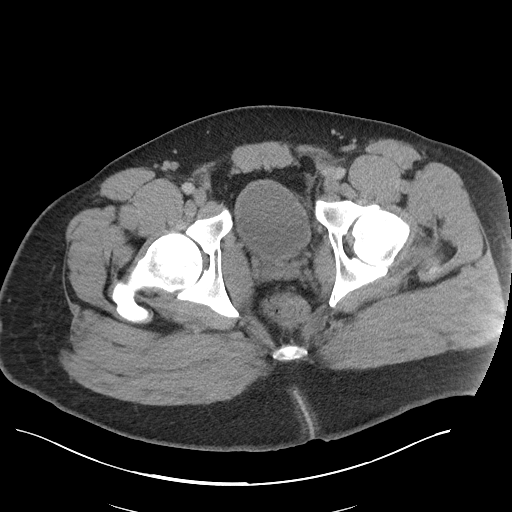
[im 30/106  soft-tissue]
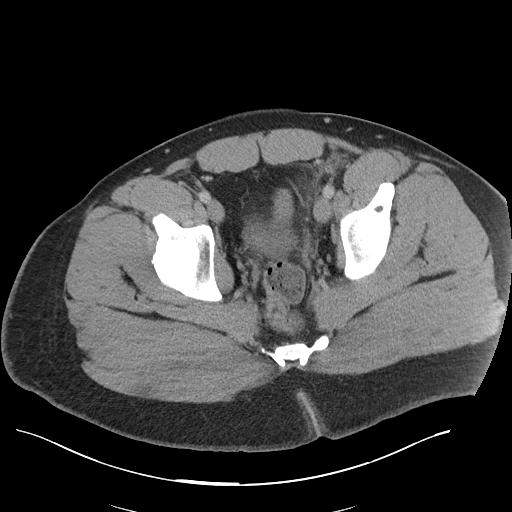
[im 36/106  soft-tissue]
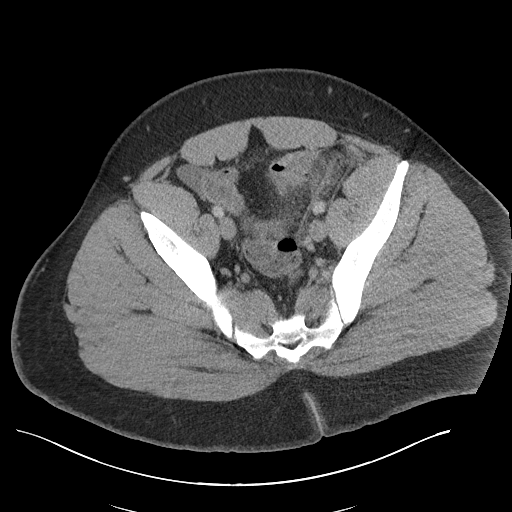
[im 41/106  soft-tissue]
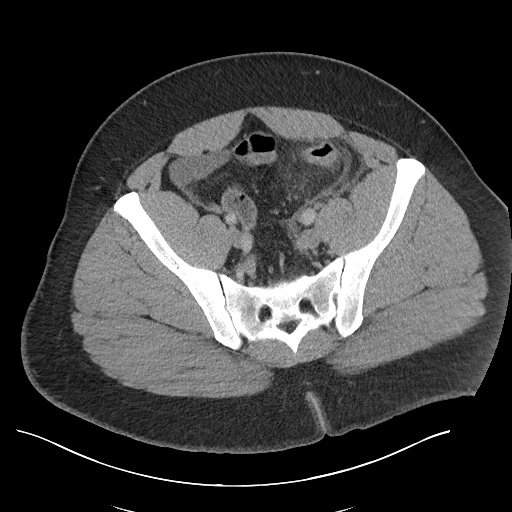
[im 47/106  soft-tissue]
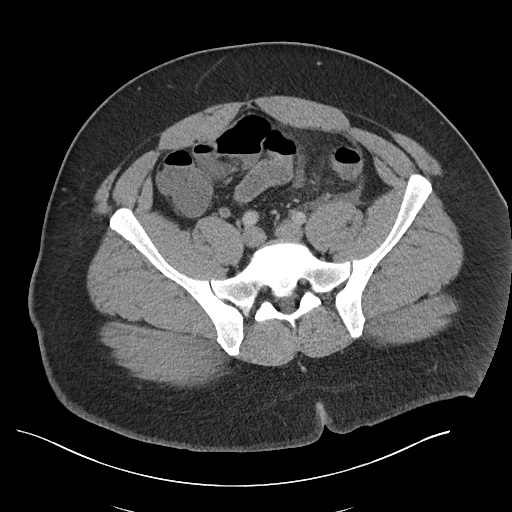
[im 59/106  soft-tissue]
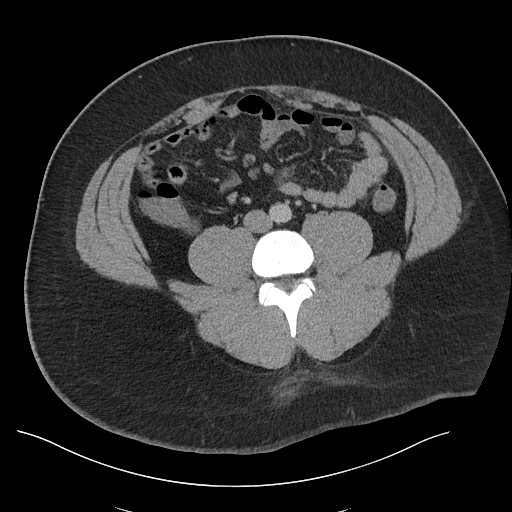
[im 65/106  soft-tissue]
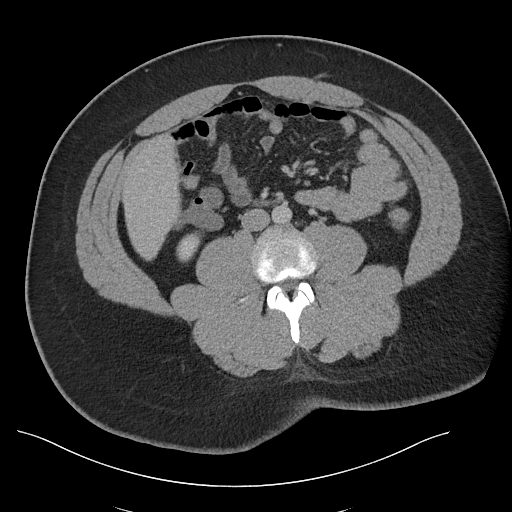
[im 65/106  bone]
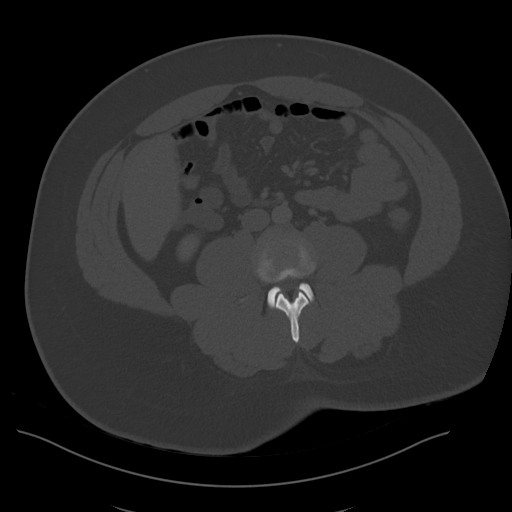
[im 71/106  soft-tissue]
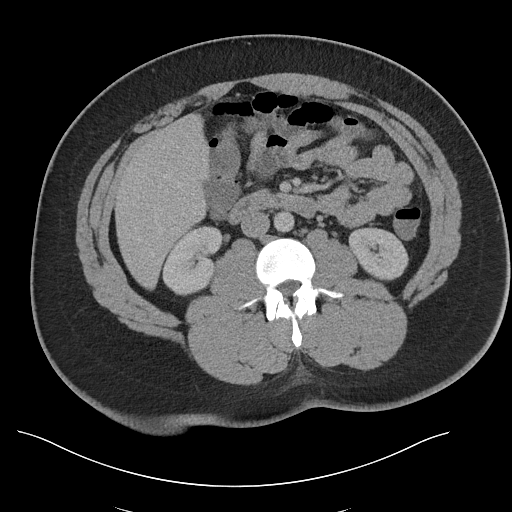
[im 76/106  soft-tissue]
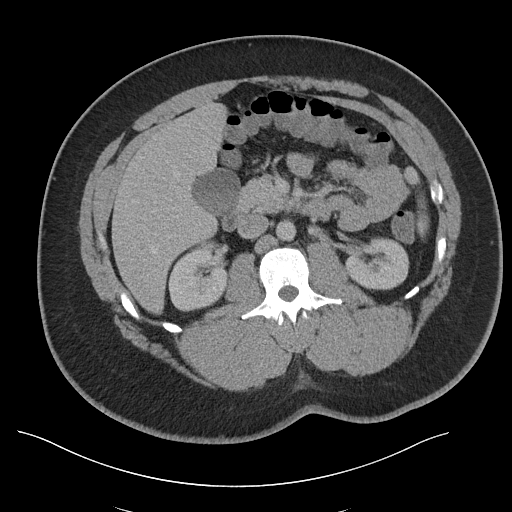
[im 82/106  soft-tissue]
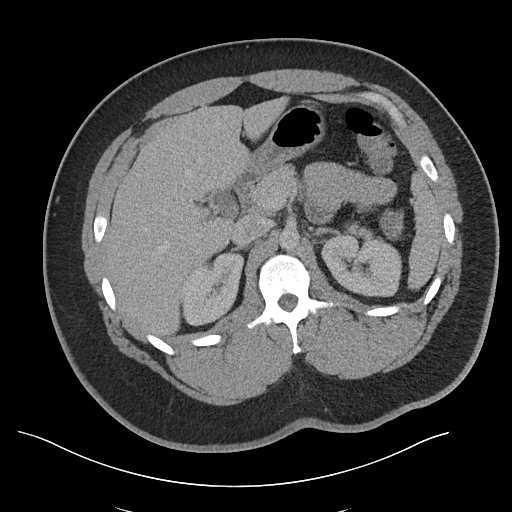
[im 94/106  soft-tissue]
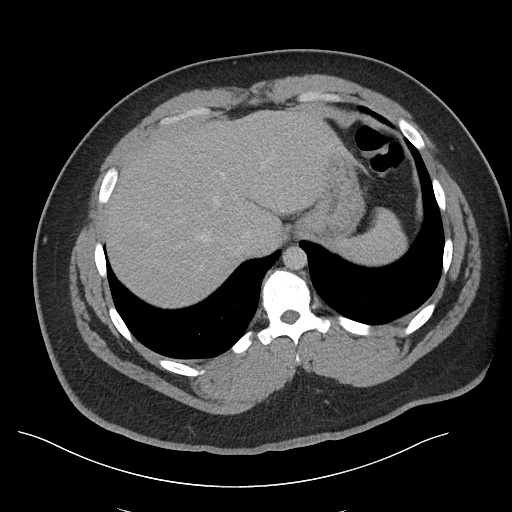
[im 100/106  soft-tissue]
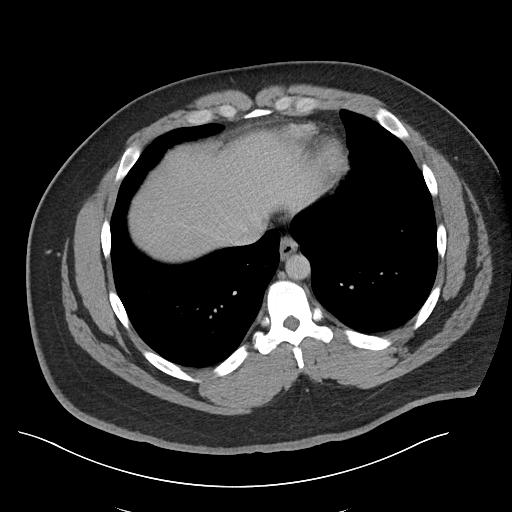

[Series 6: abdomen 3.0 mpr cor · coronal · 0.95mm/px · 3 of 128 slices shown]
[im 43/128  soft-tissue]
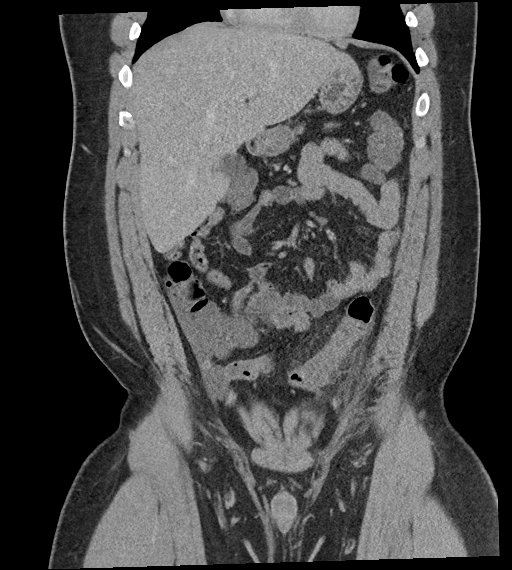
[im 57/128  soft-tissue]
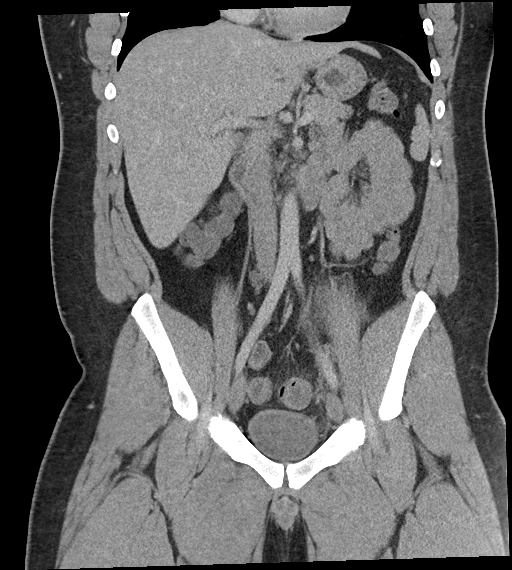
[im 71/128  soft-tissue]
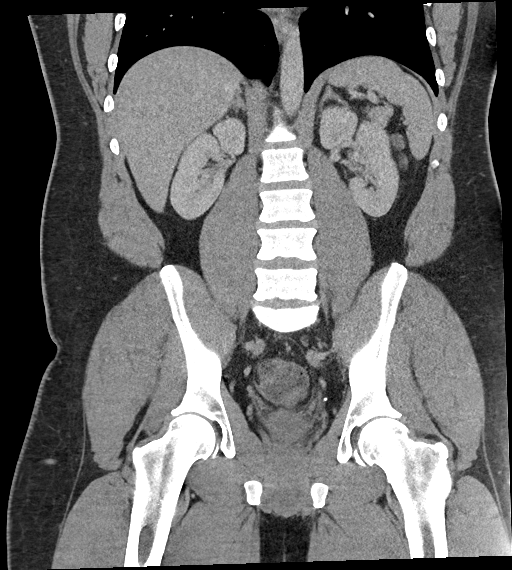

[17 of 46 positions shown; findings below may reference images not displayed]

FINDINGS: Lower chest: Lung bases are clear. No effusions. Heart is normal
size.

Hepatobiliary: No focal hepatic abnormality. Gallbladder
unremarkable.

Pancreas: No focal abnormality or ductal dilatation.

Spleen: No focal abnormality.  Normal size.

Adrenals/Urinary Tract: No adrenal abnormality. No focal renal
abnormality. No stones or hydronephrosis. Urinary bladder is
unremarkable.

Stomach/Bowel: Sigmoid diverticulosis. Inflammatory stranding around
the sigmoid colon compatible with active diverticulitis. No evidence
of bowel obstruction. Normal appendix.

Vascular/Lymphatic: No evidence of aneurysm or adenopathy.

Reproductive: No visible focal abnormality.

Other: No free fluid or free air.

Musculoskeletal: No acute bony abnormality.
IMPRESSION: Sigmoid diverticulosis with inflammatory stranding around the
sigmoid colon compatible with active diverticulitis.

## 2021-03-18 ENCOUNTER — Ambulatory Visit (HOSPITAL_COMMUNITY)
Admission: EM | Admit: 2021-03-18 | Discharge: 2021-03-19 | Disposition: A | Discharge status: Home / Self Care | Payer: BC Managed Care – PPO | Attending: Psychiatry | Admitting: Psychiatry

## 2021-03-18 DIAGNOSIS — Z9151 Personal history of suicidal behavior: Secondary | ICD-10-CM | POA: Insufficient documentation

## 2021-03-18 DIAGNOSIS — R45851 Suicidal ideations: Secondary | ICD-10-CM | POA: Diagnosis not present

## 2021-03-18 DIAGNOSIS — I10 Essential (primary) hypertension: Secondary | ICD-10-CM | POA: Insufficient documentation

## 2021-03-18 DIAGNOSIS — Z20822 Contact with and (suspected) exposure to covid-19: Secondary | ICD-10-CM | POA: Diagnosis not present

## 2021-03-18 DIAGNOSIS — F332 Major depressive disorder, recurrent severe without psychotic features: Secondary | ICD-10-CM | POA: Diagnosis not present

## 2021-03-18 LAB — LIPID PANEL
Cholesterol: 185 mg/dL (ref 0–200)
HDL: 35 mg/dL — ABNORMAL LOW (ref >40)
LDL Cholesterol: 126 mg/dL — ABNORMAL HIGH (ref 0–99)
Total CHOL/HDL Ratio: 5.3 RATIO
Triglycerides: 119 mg/dL (ref <150)
VLDL: 24 mg/dL (ref 0–40)

## 2021-03-18 LAB — COMPREHENSIVE METABOLIC PANEL
ALT: 38 U/L (ref 0–44)
AST: 49 U/L — ABNORMAL HIGH (ref 15–41)
Albumin: 4.3 g/dL (ref 3.5–5.0)
Alkaline Phosphatase: 52 U/L (ref 38–126)
Anion gap: 8 (ref 5–15)
BUN: 7 mg/dL (ref 6–20)
CO2: 28 mmol/L (ref 22–32)
Calcium: 9.5 mg/dL (ref 8.9–10.3)
Chloride: 104 mmol/L (ref 98–111)
Creatinine, Ser: 1.45 mg/dL — ABNORMAL HIGH (ref 0.61–1.24)
GFR, Estimated: >60 mL/min (ref >60)
Glucose, Bld: 98 mg/dL (ref 70–99)
Potassium: 3.8 mmol/L (ref 3.5–5.1)
Sodium: 140 mmol/L (ref 135–145)
Total Bilirubin: 0.9 mg/dL (ref 0.3–1.2)
Total Protein: 6.8 g/dL (ref 6.5–8.1)

## 2021-03-18 LAB — CBC WITH DIFFERENTIAL/PLATELET
Abs Immature Granulocytes: 0.02 10*3/uL (ref 0.00–0.07)
Basophils Absolute: 0.1 10*3/uL (ref 0.0–0.1)
Basophils Relative: 1 %
Eosinophils Absolute: 0 10*3/uL (ref 0.0–0.5)
Eosinophils Relative: 0 %
HCT: 40.8 % (ref 39.0–52.0)
Hemoglobin: 13.3 g/dL (ref 13.0–17.0)
Immature Granulocytes: 0 %
Lymphocytes Relative: 36 %
Lymphs Abs: 3.1 10*3/uL (ref 0.7–4.0)
MCH: 28.1 pg (ref 26.0–34.0)
MCHC: 32.6 g/dL (ref 30.0–36.0)
MCV: 86.1 fL (ref 80.0–100.0)
Monocytes Absolute: 0.8 10*3/uL (ref 0.1–1.0)
Monocytes Relative: 9 %
Neutro Abs: 4.5 10*3/uL (ref 1.7–7.7)
Neutrophils Relative %: 54 %
Platelets: 274 10*3/uL (ref 150–400)
RBC: 4.74 MIL/uL (ref 4.22–5.81)
RDW: 14.5 % (ref 11.5–15.5)
WBC: 8.4 10*3/uL (ref 4.0–10.5)
nRBC: 0 % (ref 0.0–0.2)

## 2021-03-18 LAB — POCT URINE DRUG SCREEN - MANUAL ENTRY (I-SCREEN)
POC Amphetamine UR: NOT DETECTED
POC Buprenorphine (BUP): NOT DETECTED
POC Cocaine UR: NOT DETECTED
POC Marijuana UR: POSITIVE — AB
POC Methadone UR: NOT DETECTED
POC Methamphetamine UR: NOT DETECTED
POC Morphine: NOT DETECTED
POC Oxazepam (BZO): NOT DETECTED
POC Oxycodone UR: NOT DETECTED
POC Secobarbital (BAR): NOT DETECTED

## 2021-03-18 LAB — POC SARS CORONAVIRUS 2 AG: SARSCOV2ONAVIRUS 2 AG: NEGATIVE

## 2021-03-18 LAB — HEMOGLOBIN A1C
Hgb A1c MFr Bld: 5.3 % (ref 4.8–5.6)
Mean Plasma Glucose: 105.41 mg/dL

## 2021-03-18 LAB — RESP PANEL BY RT-PCR (FLU A&B, COVID) ARPGX2
Influenza A by PCR: NEGATIVE
Influenza B by PCR: NEGATIVE
SARS Coronavirus 2 by RT PCR: NEGATIVE

## 2021-03-18 LAB — TSH: TSH: 1.319 u[IU]/mL (ref 0.350–4.500)

## 2021-03-18 LAB — ETHANOL: Alcohol, Ethyl (B): <10 mg/dL (ref <10)

## 2021-03-18 LAB — HIV ANTIBODY (ROUTINE TESTING W REFLEX): HIV Screen 4th Generation wRfx: NONREACTIVE

## 2021-03-18 MED ORDER — ALUM & MAG HYDROXIDE-SIMETH 200-200-20 MG/5ML PO SUSP: 30.0000 mL | ORAL | Status: DC | PRN

## 2021-03-18 MED ORDER — TRAZODONE HCL 50 MG PO TABS: 50.0000 mg | ORAL_TABLET | Freq: Every evening | ORAL | Status: DC | PRN

## 2021-03-18 MED ORDER — MAGNESIUM HYDROXIDE 400 MG/5ML PO SUSP: 30.0000 mL | Freq: Every day | ORAL | Status: DC | PRN

## 2021-03-18 MED ORDER — HYDROXYZINE HCL 25 MG PO TABS: 25.0000 mg | ORAL_TABLET | Freq: Three times a day (TID) | ORAL | Status: DC | PRN

## 2021-03-18 MED ORDER — ACETAMINOPHEN 325 MG PO TABS: 650.0000 mg | ORAL_TABLET | Freq: Four times a day (QID) | ORAL | Status: DC | PRN

## 2021-03-18 MED ORDER — NICOTINE 21 MG/24HR TD PT24
21.0000 mg | MEDICATED_PATCH | Freq: Every day | TRANSDERMAL | Status: DC
  Administered 2021-03-18 – 2021-03-19 (×2): 21 mg via TRANSDERMAL
  Filled 2021-03-18 (×2): qty 1

## 2021-03-18 MED ORDER — NICOTINE POLACRILEX 2 MG MT GUM
2.0000 mg | CHEWING_GUM | OROMUCOSAL | Status: DC | PRN
  Administered 2021-03-18: 2 mg via ORAL
  Filled 2021-03-18: qty 1

## 2021-03-18 NOTE — BH Assessment (Addendum)
Comprehensive Clinical Assessment (CCA) Note  03/18/2021 Earl Mccarthy [id number]  Disposition: Per Darrol Angel, NP, patient recommended for inpatient treatment.   [place] ED from 03/18/2021 in [place] High Risk       The patient demonstrates the following risk factors for suicide: Chronic risk factors for suicide include: psychiatric disorder of depression, substance use disorder, and previous suicide attempts x2 . Acute risk factors for suicide include: family or marital conflict. Protective factors for this patient include: positive social support, positive therapeutic relationship, and responsibility to others (children, family). Considering these factors, the overall suicide risk at this point appears to be high. Patient is not appropriate for outpatient follow up.  Earl Mccarthy is a 31 year old male presenting to [facility] [place] voluntarily with his supervisors with chief complaint of worsening depression and suicidal ideations. Patient states I lost my family and hurt people I really care about. Patient reports that his partner found out that he was cheating around Christmas Eve and now she wants to take the children and leave. Patient reports for the past 2-3 weeks his partner has been going through his phone and finding things related to him cheating. Patient reports ever time he come home and his partner confronts him on cheating. Patient reports last Sunday, he locked himself on the balcony with a gun with intentions to kill himself, but his partner stopped him, and now all the guns are locked away. Patient reports today he was feeling depressed and for unknown reasons he started walking and ended up at his job which is 30 miles away from home. Patient reports standing at a bridge contemplating jumping. Patient reports his supervisor saw him at the bridge and stopped him from harming himself and encouraged him to seek help. Patient reports  other stressors related to working two jobs and financial issues.   Patient reports diagnosis of depression and reports he started therapy last week. Patient reports history of inpatient treatment about 8 years ago related to depression and SI. Patient reports THC yesterday and has a history of alcohol use. Patient reports working two jobs and he lives at home with his partner and two children 23 and 78 months old. Patient reports having firearms that are now secure since incident on Sunday.  Patient is oriented x5, engaged, alert and cooperative. Patient eye contact is avoidant, speech is soft, and his affect is depressed with congruent mood. Patient reports passive SI at this time but appears more active earlier today when he was found by his supervisors. Patient has a history of x2 suicide attempts, most recently Sunday when he was planning on shooting himself. Patient reports AH of voices questioning his existence which could be intrusive thoughts versus AH. Patient denies HI and VH and reports THC use yesterday.       Chief Complaint:  Chief Complaint  Patient presents with   Suicidal   Visit Diagnosis: MDD recurrent, severe, without psychotic features     CCA Screening, Triage and Referral (STR)  Patient Reported Information How did you hear about [place]? Other (Comment)  What Is the Reason for Your Visit/Call Today? Suicidal  How Long Has This Been Causing You Problems? 1-6 months  What Do You Feel Would Help You the Most Today? Treatment for Depression or other mood problem   Have You Recently Had Any Thoughts About Hurting Yourself? Yes  Are You Planning to Commit Suicide/Harm Yourself At This time? Yes   Have you Recently Had  Thoughts About Hurting Someone [name]? No  Are You Planning to Harm Someone at This Time? No  Explanation: No data recorded  Have You Used Any Alcohol or Drugs in the Past 24 Hours? Yes  How Long Ago Did You Use Drugs or Alcohol? No data recorded What  Did You Use and How Much? THC   Do You Currently Have a Therapist/Psychiatrist? No data recorded Name of Therapist/Psychiatrist: No data recorded  Have You Been Recently Discharged From Any Office Practice or Programs? No data recorded Explanation of Discharge From Practice/Program: No data recorded    CCA Screening Triage Referral Assessment Type of Contact: No data recorded Telemedicine Service Delivery:   Is this Initial or Reassessment? No data recorded Date Telepsych consult ordered in [facility]:  No data recorded Time Telepsych consult ordered in [facility]:  No data recorded Location of Assessment: No data recorded Provider Location: No data recorded  Collateral Involvement: No data recorded  Does Patient Have a [employer]? No data recorded Name and Contact of Legal Guardian: No data recorded If Minor and Not Living with Parent(s), Who has Custody? No data recorded Is CPS involved or ever been involved? No data recorded Is APS involved or ever been involved? No data recorded  Patient Determined To Be At Risk for Harm To Self or Others Based on Review of Patient Reported Information or Presenting Complaint? No data recorded Method: No data recorded Availability of Means: No data recorded Intent: No data recorded Notification Required: No data recorded Additional Information for Danger to Others Potential: No data recorded Additional Comments for Danger to Others Potential: No data recorded Are There Guns or Other Weapons in Your Home? No data recorded Types of Guns/Weapons: No data recorded Are These Weapons Safely Secured?                            No data recorded Who Could Verify You Are Able To Have These Secured: No data recorded Do You Have any Outstanding Charges, Pending Court Dates, Parole/Probation? No data recorded Contacted To Inform of Risk of Harm To Self or Others: No data recorded   Does Patient Present under Involuntary Commitment? No data  recorded IVC Papers Initial File Date: No data recorded  [place] of Residence: No data recorded  Patient Currently Receiving the Following Services: No data recorded  Determination of Need: Emergent (2 hours)   Options For Referral: Outpatient Therapy; Medication Management; Inpatient Hospitalization     CCA Biopsychosocial Patient Reported Schizophrenia/Schizoaffective Diagnosis in Past: No   Strengths: No data recorded  Mental Health Symptoms Depression:   Change in energy/activity; Difficulty Concentrating; Increase/decrease in appetite; Irritability; Sleep (too much or little); Tearfulness   Duration of Depressive symptoms:  Duration of Depressive Symptoms: Greater than two weeks   Mania:   N/A   Anxiety:    Worrying; Tension; Sleep   Psychosis:   None   Duration of Psychotic symptoms:    Trauma:   None   Obsessions:   None   Compulsions:   None   Inattention:   None   Hyperactivity/Impulsivity:   None   Oppositional/Defiant Behaviors:   None   Emotional Irregularity:   None   Other Mood/Personality Symptoms:  No data recorded   Mental Status Exam Appearance and self-care  Stature:   Tall   Weight:   Overweight   Clothing:   Disheveled   Grooming:   Normal   Cosmetic  use:   None   Posture/gait:   Normal   Motor activity:   Not Remarkable   Sensorium  Attention:   Normal   Concentration:   Normal   Orientation:   X5   Recall/memory:   Normal   Affect and Mood  Affect:   Depressed   Mood:   Depressed   Relating  Eye contact:   Fleeting   Facial expression:   Depressed; Sad   Attitude toward examiner:   Cooperative   Thought and Language  Speech flow:  Clear and Coherent   Thought content:   Appropriate to Mood and Circumstances   Preoccupation:   None   Hallucinations:   Auditory   Organization:  No data recorded  [employer] of Knowledge:   Good   Intelligence:    Average   Abstraction:   Normal   Judgement:   Dangerous   Reality Testing:   Adequate   Insight:   Fair   Decision Making:   Impulsive   Social Functioning  Social Maturity:   Responsible   Social Judgement:   Normal   Stress  Stressors:   Family conflict; Financial   Coping Ability:   Exhausted; Overwhelmed   Skill Deficits:   None   Supports:   Family; Friends/Service system     Religion:    Leisure/Recreation:    Exercise/Diet:     CCA Employment/Education Employment/Work Situation: Employment / Work Situation Employment Situation: Employed Patient's Job has Been Impacted by Current Illness: Yes Describe how Patient's Job has Been Impacted: missing work  Education: Education Is Patient Currently Attending School?: No   CCA Family/Childhood History Family and Relationship History: Family history Marital status: Long term relationship What types of issues is patient dealing with in the relationship?: Pt recently cheated on his partner Does patient have children?: Yes How many children?: 2  Childhood History:  Childhood History Did patient suffer any verbal/emotional/physical/sexual abuse as a child?:  (N/A) Did patient suffer from severe childhood neglect?:  (N/A) Has patient ever been sexually abused/assaulted/raped as an adolescent or adult?:  (N/A) Was the patient ever a victim of a crime or a disaster?:  (N/A) Witnessed domestic violence?:  (N/A) Has patient been affected by domestic violence as an adult?:  (N/A)  Child/Adolescent Assessment:     CCA Substance Use Alcohol/Drug Use: Alcohol / Drug Use Pain Medications: See MAR Prescriptions: See MAR Over the Counter: See MAR History of alcohol / drug use?: Yes Longest period of sobriety (when/how long): na Substance #1 Name of Substance 1: THC 1 - Duration: ongoing 1 - Last Use / Amount: yesterday                       ASAM's:  Six Dimensions of  Multidimensional Assessment  Dimension 1:  Acute Intoxication and/or Withdrawal Potential:      Dimension 2:  Biomedical Conditions and Complications:      Dimension 3:  Emotional, Behavioral, or Cognitive Conditions and Complications:     Dimension 4:  Readiness to Change:     Dimension 5:  Relapse, Continued use, or Continued Problem Potential:     Dimension 6:  Recovery/Living Environment:     ASAM Severity Score:    ASAM Recommended Level of Treatment: ASAM Recommended Level of Treatment: Level I Outpatient Treatment   Substance use Disorder (SUD)    Recommendations for Services/Supports/Treatments: Recommendations for Services/Supports/Treatments Recommendations For Services/Supports/Treatments: Individual Therapy  Discharge Disposition:  DSM5 Diagnoses: Patient Active Problem List   Diagnosis Date Noted   MDD (major depressive disorder), single episode, severe ([place]) 09/22/2012     Referrals to Alternative Service(s): Referred to Alternative Service(s):   Place:   Date:   Time:    Referred to Alternative Service(s):   Place:   Date:   Time:    Referred to Alternative Service(s):   Place:   Date:   Time:    Referred to Alternative Service(s):   Place:   Date:   Time:     Luther Redo, [facility]

## 2021-03-18 NOTE — BH Assessment (Addendum)
Pt reports suicidal ideation today at this time seems more passive however he reports walking 30 miles to work today with thoughts of wanting to jump off a bridge. Pt was seen by his supervisor who was able to talk him out of harming self and assisted him to [facility]. Pt reports last Sunday he had SI with plan to shoot himself with a gun, but his partner stopped him. Pt denies HI and VH however reports AH of hearing voices saying "why are you alive". Pt reports trigger to SI is due to his partner finding out that he was cheating. Pt reports issues has been going on since Christmas. Pt reports his partner said she was going to leave him and take his two children (9 and 3 months).   Pt emergent

## 2021-03-18 NOTE — ED Notes (Addendum)
Attempted blood draw x2 unsuccessfully.  Second nurse also attempted blood draw with no success.  Pt states he is a hard stick generally and when asked if he has been drinking fluids the pt said not lately. Provider notified.

## 2021-03-18 NOTE — ED Provider Notes (Addendum)
Behavioral Health Admission H&P [facility](FBC & OBS)  Date: 03/18/21 Patient Name: Earl DerrickDanye Mccarthy MRN: [id number] Chief Complaint:  Chief Complaint  Patient presents with   Suicidal     Diagnoses:  Final diagnoses:  Suicidal ideation  MDD (major depressive disorder), recurrent severe, without psychosis (HCC)    HPI: [facility] as a [facility] in voluntarily accompanied by his supervisors with chief complaint of worsening depression and suicidal ideations.   Earl Mccarthy, 31 y.o., male patient seen face to face by this provider, consulted with Dr. Bronwen BettersLaubach and chart reviewed on 03/18/21. On evaluation, is in a sitting position in no acute distress. He is alert and oriented x 4. He is calm and cooperative. His mood is depressed and affect is congruent. He is speaking in a clear tone at moderate volume, and normal pace; with minimum eye contact. His thought process is coherent and relevant. There is no indication that he is currently responding to internal/external stimuli or experiencing delusional thought content.    He reports that he attempted to commit suicide this morning and his manager stopped him from jumping off a bridge. He reports that he walked to work this morning and left everything at work and his manager tried to talk to him but he left the office. He reports that he was contemplating jumping off the bridge. He reports that he destroyed his family by talking to several women.  He reports that his girlfriend, his child's mother has been going through his phone since Christmas Eve all times of the day. He reports that she will go through his phone at 3 AM, 4 AM and any time during the day.  He reports that his girlfriend woke him up at 3 AM this morning going through his phone. He reports that he is currently seeking therapy for his problem but his girlfriend is not having it. He reports that he has been in therapy for the past week and his next session is tomorrow. He reports  [place] and he is unable to recall the name of the facility or therapist at this time.  He reports feeling increasingly depressed since Christmas eve and describes his depressive symptoms as sadness, hopelessness, worthlessness, crying spells, decreased appetite, poor sleep, weight loss of 20 pounds and anhedonia. He endorses AVH and describes it as voices inside his head telling him that he is a failure, he is not good enough and he does not deserve to be here. He endorses seeing images of ways he can kill himself.  He reports 2 past suicide attempts. He reports he most recently attempted suicide last Sunday or the Sunday before, where he blacked out on the balcony and was about to shoot himself. He reports a previous suicide attempt 8 years ago by overdose and was hospitalized. He denies self-injurious behaviors. He denies currently taking psychotropics. He reports a medical history of hypertension. He states that he does not take medication for hypertension and that he loss weight to control his blood pressure.  He reports that he resides with his girlfriend, an almost 4220-month-old baby, and his 31-year-old son visits over the weekend. 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 He reports occasional marijuana use. He denies drinking alcohol.    PHQ 2-9:   [facility]  C-SSRS RISK CATEGORY High Risk  Total Time spent with patient: 30 minutes  Musculoskeletal  Strength & Muscle Tone: within normal limits Gait & Station: normal Patient leans: N/A  Psychiatric Specialty Exam  Presentation General Appearance: Appropriate for Environment  Eye Contact:Fair  Speech:Clear and Coherent  Speech Volume:Normal  Handedness:No data recorded  Mood and Affect  Mood:Depressed  Affect:Congruent   Thought  Process  Thought Processes:Coherent  Descriptions of Associations:Intact  Orientation:Full (Time, Place and Person)  Thought Content:WDL  Diagnosis of Schizophrenia or Schizoaffective disorder in past: No   Hallucinations:Hallucinations: None  Ideas of Reference:None  Suicidal Thoughts:Suicidal Thoughts: Yes, Active SI Active Intent and/or Plan: With Intent; With Plan  Homicidal Thoughts:Homicidal Thoughts: No   Sensorium  Memory:Immediate Fair; Recent Fair; Remote Fair  Judgment:Poor  Insight:Lacking   Executive Functions  Concentration:Fair  Attention Span:Fair  [place]   Psychomotor Activity  Psychomotor Activity:Psychomotor Activity: Normal   Assets  Assets:Communication Skills; Desire for Improvement; Financial Resources/Insurance; Housing; Leisure Time; Intimacy; Physical Health; Transportation   Sleep  Sleep:Sleep: Poor   Nutritional Assessment (For OBS and FBC admissions only) Has the patient had a weight loss or gain of 10 pounds or more in the last 3 months?: Yes Has the patient had a decrease in food intake/or appetite?: Yes Does the patient have dental problems?: No Does the patient have eating habits or behaviors that may be indicators of an eating disorder including binging or inducing vomiting?: No Has the patient recently lost weight without trying?: 1 Has the patient been eating poorly because of a decreased appetite?: 1 Malnutrition Screening Tool Score: 2    Physical Exam Constitutional:      Appearance: Normal appearance.  HENT:     Head: Normocephalic and atraumatic.     Nose: Nose normal.  Eyes:     Conjunctiva/sclera: Conjunctivae normal.  Cardiovascular:     Rate and Rhythm: Normal rate.  Pulmonary:     Effort: Pulmonary effort is normal.  Musculoskeletal:        General: Normal range of motion.     Cervical back: Normal range of motion.  Neurological:     Mental Status:  He is alert and oriented to person, place, and time.   Review of Systems  Constitutional: Negative.   HENT: Negative.    Eyes: Negative.   Respiratory: Negative.    Cardiovascular: Negative.   Gastrointestinal: Negative.   Genitourinary: Negative.   Musculoskeletal: Negative.   Skin: Negative.   Neurological: Negative.   Psychiatric/Behavioral:  Positive for depression and suicidal ideas.    Blood pressure (!) 178/85, pulse 70, temperature 98.1 F (36.7 C), temperature source Oral, resp. rate 18, SpO2 100 %. There is no height or weight on file to calculate BMI. B/P repeat 115/77  Past Psychiatric History: hx of MDD. Hospitalized at [facility] in 2014.  Is the patient at risk to self? Yes  Has the patient been a risk to self in the past 6 months? No .    Has the patient been a risk to self within the distant past? Yes   Is the patient a risk to others? No   Has the patient been a risk to others in the past 6 months? No   Has the patient been a risk to others within the distant past? No   Past Medical History: No past medical history on file. No past surgical history on file.  Family History:  Family History  Problem Relation Age of Onset   Cancer  Mother    Hypertension Father     Social History:  Social History   Socioeconomic History   Marital status: Single    Spouse name: Not on file   Number of children: Not on file   Years of education: Not on file   Highest education level: Not on file  Occupational History   Not on file  Tobacco Use   Smoking status: Former    Packs/day: 0.25    Years: 0.50    Pack years: 0.13    Types: Cigarettes   Smokeless tobacco: Never  Substance and Sexual Activity   Alcohol use: Yes   Drug use: Not Currently    Comment: sometimes-socially "a few shots"    Sexual activity: Yes    Birth control/protection: Condom  Other Topics Concern   Not on file  Social History Narrative   Not on file   Social Determinants of Health    Financial Resource Strain: Not on file  Food Insecurity: Not on file  Transportation Needs: Not on file  Physical Activity: Not on file  Stress: Not on file  Social Connections: Not on file  Intimate Partner Violence: Not on file    SDOH:  SDOH Screenings   Alcohol Screen: Not on file  Depression (PHQ2-9): Not on file  Financial Resource Strain: Not on file  Food Insecurity: Not on file  Housing: Not on file  Physical Activity: Not on file  Social Connections: Not on file  Stress: Not on file  Tobacco Use: Not on file  Transportation Needs: Not on file    Last Labs:  Admission on 03/18/2021  Component Date Value Ref Range Status   POC Amphetamine UR 03/18/2021 None Detected  NONE DETECTED (Cut Off Level 1000 ng/mL) Final   POC Secobarbital (BAR) 03/18/2021 None Detected  NONE DETECTED (Cut Off Level 300 ng/mL) Final   POC Buprenorphine (BUP) 03/18/2021 None Detected  NONE DETECTED (Cut Off Level 10 ng/mL) Final   POC Oxazepam (BZO) 03/18/2021 None Detected  NONE DETECTED (Cut Off Level 300 ng/mL) Final   POC Cocaine UR 03/18/2021 None Detected  NONE DETECTED (Cut Off Level 300 ng/mL) Final   POC Methamphetamine UR 03/18/2021 None Detected  NONE DETECTED (Cut Off Level 1000 ng/mL) Final   POC Morphine 03/18/2021 None Detected  NONE DETECTED (Cut Off Level 300 ng/mL) Final   POC Oxycodone UR 03/18/2021 None Detected  NONE DETECTED (Cut Off Level 100 ng/mL) Final   POC Methadone UR 03/18/2021 None Detected  NONE DETECTED (Cut Off Level 300 ng/mL) Final   POC Marijuana UR 03/18/2021 Positive (A)  NONE DETECTED (Cut Off Level 50 ng/mL) Final   SARSCOV2ONAVIRUS 2 AG 03/18/2021 NEGATIVE  NEGATIVE Final   Comment: (NOTE) SARS-CoV-2 antigen NOT DETECTED.   Negative results are presumptive.  Negative results do not preclude SARS-CoV-2 infection and should not be used as the sole basis for treatment or other patient management decisions, including infection  control decisions,  particularly in the presence of clinical signs and  symptoms consistent with COVID-19, or in those who have been in contact with the virus.  Negative results must be combined with clinical observations, patient history, and epidemiological information. The expected result is Negative.  Fact Sheet for Patients: HandmadeRecipes.com.cy  Fact Sheet for Healthcare Providers: FuneralLife.at  This test is not yet approved or cleared by the [place] FDA and  has been authorized for detection and/or diagnosis of SARS-CoV-2 by FDA under an Emergency Use Authorization (EUA).  This  EUA will remain in effect (meaning this test can be used) for the duration of  the COV                          ID-19 declaration under Section 564(b)(1) of the Act, 21 U.S.C. section 360bbb-3(b)(1), unless the authorization is terminated or revoked sooner.      Allergies: Bee venom and Strawberry extract  PTA Medications: (Not in a hospital admission)   Medical Decision Making  Patient admitted to the [facility] continuous assessment. Patient is recommended for inpatient psychiatric treatment. Patient is voluntary.  Labs:  Lab Orders         Resp Panel by RT-PCR (Flu A&B, Covid) Nasopharyngeal Swab         CBC with Differential/Platelet         Comprehensive metabolic panel         Hemoglobin A1c         Ethanol         Lipid panel         TSH         RPR         HIV Antibody (routine testing w rflx)         POCT Urine Drug Screen - (ICup)         POC SARS Coronavirus 2 Ag-ED - Nasal Swab         POC SARS Coronavirus 2 Ag       Recommendations  Based on my evaluation the patient does not appear to have an emergency medical condition.  Vuk Skillern L, NP 03/18/21  1:23 PM

## 2021-03-18 NOTE — Progress Notes (Signed)
[facility] LCSW Progress Note   03/18/2021    12:35 PM  Mckinnon Gummo   [id number]   Type of Contact and Topic:  Psychiatric Bed Placement     Patient information has been sent to [facility] via secure chat to review for potential admission. Patient has not yet been accepted at this time. Patient meets inpatient criteria per Darrol Angel, NP.   Patient must have labs drawn prior to being accepted. Nursing notified.   Situation ongoing, CSW will continue to monitor and update note as more information becomes available.    Signed:  Durenda Hurt, MSW, LCSWA, LCAS 03/18/2021 12:35 PM

## 2021-03-18 NOTE — Progress Notes (Signed)
Lab draw attempt unsuccessful. Urine collected and Covid specimen collected.

## 2021-03-18 NOTE — Progress Notes (Signed)
Patient admitted to [facility] unit/ oriented. Offered food/ liquid patient declined. EKG machine not working attempted to perform without success.

## 2021-03-18 NOTE — ED Notes (Signed)
Pt A&O x 4, sleeping at present, no distress noted.  Monitoring for safety. 

## 2021-03-18 NOTE — Progress Notes (Signed)
Patient encourage to drink fluids. Patient remains calm. Nursing staff will continue to monitor.

## 2021-03-18 NOTE — Progress Notes (Signed)
Patient resting without labored respirations, respirations are even without acute distress. Nursing staff will continue to monitor.

## 2021-03-18 NOTE — Progress Notes (Signed)
Pt was accepted to [facility] 03/19/21 after 12pm per [place], RN; Bed assignment-Room [phone]. Diagnosis MDD, severe.  Pt meets inpatient criteria per Darrol Angel, NP.   Attending Physician will be Dr. Caswell Corwin.  Report can be called to: Adult unit: [phone]  Pt can arrive after 03/19/21 after 12pm  Care Team notified via secure chat: [facility] Clayborne Dana, RN, Miguel Rota, RN, Tomasita Morrow, Cornelia Copa, RN, [facility], [place] Reader, RN, and night [facility] Wynonia Hazard, [place].  Nadara Mode, LCSWA 03/18/2021 @ 9:06 PM

## 2021-03-19 ENCOUNTER — Other Ambulatory Visit: Payer: Self-pay

## 2021-03-19 ENCOUNTER — Inpatient Hospital Stay (HOSPITAL_COMMUNITY)
Admission: AD | Admit: 2021-03-19 | Discharge: 2021-03-24 | DRG: 885 | Disposition: A | Discharge status: Home / Self Care | Payer: BC Managed Care – PPO | Source: Intra-hospital | Attending: Psychiatry | Admitting: Psychiatry

## 2021-03-19 ENCOUNTER — Encounter (HOSPITAL_COMMUNITY): Payer: Self-pay | Admitting: Behavioral Health

## 2021-03-19 DIAGNOSIS — F322 Major depressive disorder, single episode, severe without psychotic features: Secondary | ICD-10-CM | POA: Diagnosis present

## 2021-03-19 DIAGNOSIS — Z635 Disruption of family by separation and divorce: Secondary | ICD-10-CM

## 2021-03-19 DIAGNOSIS — F332 Major depressive disorder, recurrent severe without psychotic features: Secondary | ICD-10-CM

## 2021-03-19 DIAGNOSIS — Z91030 Bee allergy status: Secondary | ICD-10-CM | POA: Diagnosis not present

## 2021-03-19 DIAGNOSIS — G4700 Insomnia, unspecified: Secondary | ICD-10-CM | POA: Diagnosis present

## 2021-03-19 DIAGNOSIS — R45851 Suicidal ideations: Secondary | ICD-10-CM | POA: Diagnosis present

## 2021-03-19 DIAGNOSIS — Z9102 Food additives allergy status: Secondary | ICD-10-CM

## 2021-03-19 DIAGNOSIS — F410 Panic disorder [episodic paroxysmal anxiety] without agoraphobia: Secondary | ICD-10-CM | POA: Diagnosis present

## 2021-03-19 DIAGNOSIS — Z23 Encounter for immunization: Secondary | ICD-10-CM | POA: Diagnosis not present

## 2021-03-19 DIAGNOSIS — M545 Low back pain, unspecified: Secondary | ICD-10-CM | POA: Diagnosis present

## 2021-03-19 DIAGNOSIS — F17210 Nicotine dependence, cigarettes, uncomplicated: Secondary | ICD-10-CM | POA: Diagnosis present

## 2021-03-19 DIAGNOSIS — Z62810 Personal history of physical and sexual abuse in childhood: Secondary | ICD-10-CM | POA: Diagnosis present

## 2021-03-19 LAB — RPR: RPR Ser Ql: NONREACTIVE

## 2021-03-19 LAB — GC/CHLAMYDIA PROBE AMP (~~LOC~~) NOT AT ARMC
Chlamydia: NEGATIVE
Comment: NEGATIVE
Comment: NORMAL
Neisseria Gonorrhea: NEGATIVE

## 2021-03-19 MED ORDER — NICOTINE 21 MG/24HR TD PT24
21.0000 mg | MEDICATED_PATCH | Freq: Every day | TRANSDERMAL | Status: DC
  Administered 2021-03-20 – 2021-03-23 (×4): 21 mg via TRANSDERMAL
  Filled 2021-03-19 (×7): qty 1

## 2021-03-19 MED ORDER — TRAZODONE HCL 50 MG PO TABS
50.0000 mg | ORAL_TABLET | Freq: Every evening | ORAL | Status: DC | PRN
  Filled 2021-03-19: qty 1

## 2021-03-19 MED ORDER — PNEUMOCOCCAL VAC POLYVALENT 25 MCG/0.5ML IJ INJ
0.5000 mL | INJECTION | INTRAMUSCULAR | Status: AC
  Administered 2021-03-20: 0.5 mL via INTRAMUSCULAR
  Filled 2021-03-19: qty 0.5

## 2021-03-19 MED ORDER — HYDROXYZINE HCL 25 MG PO TABS
25.0000 mg | ORAL_TABLET | Freq: Three times a day (TID) | ORAL | Status: DC | PRN
  Administered 2021-03-20 – 2021-03-23 (×2): 25 mg via ORAL
  Filled 2021-03-19 (×3): qty 1

## 2021-03-19 MED ORDER — MAGNESIUM HYDROXIDE 400 MG/5ML PO SUSP: 30.0000 mL | Freq: Every day | ORAL | Status: DC | PRN

## 2021-03-19 MED ORDER — ACETAMINOPHEN 325 MG PO TABS
650.0000 mg | ORAL_TABLET | Freq: Four times a day (QID) | ORAL | Status: DC | PRN
  Administered 2021-03-22 – 2021-03-23 (×3): 650 mg via ORAL
  Filled 2021-03-19 (×4): qty 2

## 2021-03-19 MED ORDER — ALUM & MAG HYDROXIDE-SIMETH 200-200-20 MG/5ML PO SUSP: 30.0000 mL | ORAL | Status: DC | PRN

## 2021-03-19 NOTE — ED Notes (Signed)
Pt sleeping at present, no distress noted.  Monitoring for safety. 

## 2021-03-19 NOTE — ED Provider Notes (Signed)
FBC/OBS ASAP Discharge Summary  Date and Time: 03/19/2021 9:49 AM  Name: Earl Mccarthy  MRN:  [id number]   Discharge Diagnoses:  Final diagnoses:  Suicidal ideation  MDD (major depressive disorder), recurrent severe, without psychosis ([place])    Subjective: Patient reports feeling sore this morning after he walked about 30 miles yesterday from his house to work. He reports that he spoke to his his mom yesterday and she told him that he needed to stay away from his girlfriend because she puts him down and he ends up doing things that he shouldn't do. He states that he takes full responsibility for putting himself in this place by having unhealthy relationships with women. He reports that it is unlike him to do the things that he has done but when he blackouts he does things like walk 30 miles contemplating jumping off a bridge or trying to shoot himself with a gun. When asked to elaborate on what he means by blacking out, he states that he does not recall the motions that lead up to him contemplating jumping off a bridge or holding a gun to kill himself. He currently denies SI/HI/AVH. He does not appear to be responding to internal or external stimuli. He describes his depressive symptoms today as sadness, worthlessness, hopelessness, irritability, and crying spells.   Stay Summary: Patient presented to [facility] on 03/18/21 as a walk in voluntarily accompanied by his supervisors with chief complaint of worsening depression and suicidal ideations. At that time, he reported that he attempted to commit suicide and his manager stopped him from jumping off a bridge. Patient was admitted to the [facility] continuous assessment and recommended for inpatient psychiatric treatment. Labs obtained includes: CBC, CMP, UDS, etoh,TSH, lipid, A1c, HIV panel, RPR, and urine GC/Chlamydia. Patient was started on the Vistaril 25 mg po 3 times daily as needed for anxiety and trazodone 50 mg po nightly as needed for sleep. Patient has  been observed on the unit without any disruptive, aggressive, or self-harm behaviors.    Total Time spent with patient: 15 minutes  Past Psychiatric History:  Past Medical History: No past medical history on file. No past surgical history on file. Family History:  Family History  Problem Relation Age of Onset   Cancer Mother    Hypertension Father    Family Psychiatric History: Hx of MDD. Hospitalized at [facility] in 2014. Social History:  Social History   Substance and Sexual Activity  Alcohol Use Yes     Social History   Substance and Sexual Activity  Drug Use Not Currently   Comment: sometimes-socially "a few shots"     Social History   Socioeconomic History   Marital status: Single    Spouse name: Not on file   Number of children: Not on file   Years of education: Not on file   Highest education level: Not on file  Occupational History   Not on file  Tobacco Use   Smoking status: Former    Packs/day: 0.25    Years: 0.50    Pack years: 0.13    Types: Cigarettes   Smokeless tobacco: Never  Substance and Sexual Activity   Alcohol use: Yes   Drug use: Not Currently    Comment: sometimes-socially "a few shots"    Sexual activity: Yes    Birth control/protection: Condom  Other Topics Concern   Not on file  Social History Narrative   Not on file   Social Determinants of Radio broadcast assistant  Strain: Not on file  Food Insecurity: Not on file  Transportation Needs: Not on file  Physical Activity: Not on file  Stress: Not on file  Social Connections: Not on file   SDOH:  SDOH Screenings   Alcohol Screen: Not on file  Depression (PHQ2-9): Not on file  Financial Resource Strain: Not on file  Food Insecurity: Not on file  Housing: Not on file  Physical Activity: Not on file  Social Connections: Not on file  Stress: Not on file  Tobacco Use: Not on file  Transportation Needs: Not on file    Tobacco Cessation:  A prescription for an FDA-approved  tobacco cessation medication provided at discharge  Current Medications:  Current Facility-Administered Medications  Medication Dose Route Frequency Provider Last Rate Last Admin   acetaminophen (TYLENOL) tablet 650 mg  650 mg Oral Q6H PRN Miller Limehouse L, NP       alum & mag hydroxide-simeth (MAALOX/MYLANTA) 200-200-20 MG/5ML suspension 30 mL  30 mL Oral Q4H PRN Lydia Meng L, NP       hydrOXYzine (ATARAX) tablet 25 mg  25 mg Oral TID PRN Jenilee Franey L, NP       magnesium hydroxide (MILK OF MAGNESIA) suspension 30 mL  30 mL Oral Daily PRN Hrishikesh Hoeg L, NP       nicotine (NICODERM CQ - dosed in mg/24 hours) patch 21 mg  21 mg Transdermal Daily Rache Klimaszewski L, NP   21 mg at 03/19/21 0933   traZODone (DESYREL) tablet 50 mg  50 mg Oral QHS PRN Kou Gucciardo L, NP       No current outpatient medications on file.    PTA Medications: (Not in a hospital admission)   Musculoskeletal  Strength & Muscle Tone: within normal limits Gait & Station: normal Patient leans: N/A  Psychiatric Specialty Exam  Presentation  General Appearance: Appropriate for Environment  Eye Contact:Fair  Speech:Clear and Coherent  Speech Volume:Normal  Handedness:No data recorded  Mood and Affect  Mood:Depressed  Affect:Congruent   Thought Process  Thought Processes:Coherent  Descriptions of Associations:Intact  Orientation:Full (Time, Place and Person)  Thought Content:WDL  Diagnosis of Schizophrenia or Schizoaffective disorder in past: No    Hallucinations:Hallucinations: None  Ideas of Reference:None  Suicidal Thoughts:Suicidal Thoughts: No SI Active Intent and/or Plan: With Intent; With Plan  Homicidal Thoughts:Homicidal Thoughts: No   Sensorium  Memory:Immediate Fair; Recent Fair; Remote Fair  Judgment:Intact  Insight:Present   Executive Functions  Concentration:Fair  Attention Span:Fair  [place]   Psychomotor  Activity  Psychomotor Activity:Psychomotor Activity: Normal   Assets  Assets:Communication Skills; Desire for Improvement; Financial Resources/Insurance; Housing; Intimacy; Leisure Time; Physical Health; Resilience; Social Support; Talents/Skills; Transportation; Vocational/Educational   Sleep  Sleep:Sleep: Fair   Nutritional Assessment (For OBS and FBC admissions only) Has the patient had a weight loss or gain of 10 pounds or more in the last 3 months?: Yes Has the patient had a decrease in food intake/or appetite?: Yes Does the patient have dental problems?: No Does the patient have eating habits or behaviors that may be indicators of an eating disorder including binging or inducing vomiting?: No Has the patient recently lost weight without trying?: 1 Has the patient been eating poorly because of a decreased appetite?: 1 Malnutrition Screening Tool Score: 2    Physical Exam  Physical Exam Constitutional:      Appearance: Normal appearance.  HENT:     Head: Normocephalic and atraumatic.  Nose: Nose normal.  Eyes:     Conjunctiva/sclera: Conjunctivae normal.  Cardiovascular:     Rate and Rhythm: Normal rate.  Pulmonary:     Effort: Pulmonary effort is normal.  Musculoskeletal:        General: Normal range of motion.  Neurological:     Mental Status: He is alert and oriented to person, place, and time.   Review of Systems  Constitutional: Negative.   HENT: Negative.    Eyes: Negative.   Respiratory: Negative.    Cardiovascular: Negative.   Gastrointestinal: Negative.   Genitourinary: Negative.   Musculoskeletal:  Positive for joint pain.  Skin: Negative.   Neurological: Negative.   Endo/Heme/Allergies: Negative.   Blood pressure 138/79, pulse 82, temperature 98.1 F (36.7 C), temperature source Oral, resp. rate 16, SpO2 100 %. There is no height or weight on file to calculate BMI.  Plan Of Care/Follow-up recommendations:  Activity:  as tolerated    Disposition: Patient accepted to [facility] by Laddie Aquas., for inpatient treatment. Patient is voluntary. Admission orders placed. EMTALA completed.   Marissa Calamity, NP 03/19/2021, 9:49 AM

## 2021-03-19 NOTE — ED Notes (Signed)
Pt taken via safe transport to [facility].  Belongings returned from locker 25.  Paperwork sent with pt.

## 2021-03-19 NOTE — ED Notes (Signed)
Sitting quietly.  Breakfast provided.  No pain or discomfort noted or voiced.  Denies AVH.  Passing SI reported with no plan at this time.  Will continue to monitor for safety.

## 2021-03-19 NOTE — Discharge Instructions (Addendum)
Patient accepted to [facility]  

## 2021-03-19 NOTE — ED Notes (Signed)
Pt drank 24 Oz of apple juice, to assist in fluid intake.
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
## 2021-03-19 NOTE — ED Notes (Signed)
Nurse to nurse report called to Will, RN.  Safe transport notified of need to transport pt to [facility].  Pt has signed voluntary form and it has been faxed to [facility] prior to sending pt.  Safety maintained.

## 2021-03-19 NOTE — ED Notes (Signed)
Pt given snack. 

## 2021-03-19 NOTE — ED Notes (Signed)
Attempted to call nurse to nurse report x2 at [facility].  Unable to speak with someone at this time, unable to leave voice mail at this time.  Will continue to reach out to provide report.

## 2021-03-19 NOTE — Progress Notes (Signed)
Adult Psychoeducational Group Note  Date:  03/19/2021 Time:  11:02 PM  Group Topic/Focus:  Wrap-Up Group:   The focus of this group is to help patients review their daily goal of treatment and discuss progress on daily workbooks.  Participation Level:  Minimal  Participation Quality:  Appropriate  Affect:  Anxious  Cognitive:  Disorganized  Insight: Limited  Engagement in Group:  Limited  Modes of Intervention:  Discussion  Additional Comments:  Pt stated his goal for today was to focus on his treatment plan. Pt stated he accomplished his goal today. Pt stated he talked with his doctor but did not get a chance to speak with his social worker about his care today. Pt rated his overall day a 6 out of 10. Pt stated he was able to contact his aunt and mother today which improved his overall day. Pt stated he felt better about himself today. Pt stated he was able to attend all meals. Pt stated he took all medications provided today. Pt stated he attend all groups held today. Pt stated his appetite was fair today. Pt rated sleep last night was pretty good. Pt stated the goal tonight was to get some rest. Pt stated he had no physical pain tonight. Pt deny visual hallucinations and auditory issues tonight. Pt denies thoughts of harming himself or others. Pt stated he would alert staff if anything changed.  Candy Sledge 03/19/2021, 11:02 PM

## 2021-03-19 NOTE — Tx Team (Signed)
Initial Treatment Plan 03/19/2021 6:08 PM Earl Mccarthy WUJ:[id number]    PATIENT STRESSORS: Financial difficulties   Marital or family conflict   Substance abuse     PATIENT STRENGTHS: Ability for insight  Communication skills  Supportive family/friends    PATIENT IDENTIFIED PROBLEMS: Suicidal thoughts  Substance use disorder - THC  Depression  Anxiety Family conflict.                DISCHARGE CRITERIA:  Ability to meet basic life and health needs Improved stabilization in mood, thinking, and/or behavior Medical problems require only outpatient monitoring  PRELIMINARY DISCHARGE PLAN: Family therapy  PATIENT/FAMILY INVOLVEMENT: This treatment plan has been presented to and reviewed with the patient, Earl Mccarthy,.  The patient has been given the opportunity to ask questions and make suggestions.  Garnette Scheuermann, RN 03/19/2021, 6:08 PM

## 2021-03-19 NOTE — Progress Notes (Signed)
Pt is a 31 y.o. male who was admitted at [facility] due to SI.  Pt transferred to [facility] after having been at [facility].  Pt's admission to [facility]  is voluntary. Pt said that he he let his girlfriend down by communicating with other women via pt's phone.  Pt has a lot of guilt about betraying his girlfriend's trust and says "I'm a horrible person."  Pt denies SI/HI on admission but says, "I hear voices, and the voices tell me how I could strategically rid myself from this world but I don't listen to them, I fight them off." Pt has 31 month old child with his current girlfriend and a 44 year old from a previous relationship.  Pt was tearful during admission and expressed sadness and depression.  Skin assessment showed corn on left big toe, a blister on the bottom of left toe and raised bumps behind his neck.  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  Pt has medical history of hypertension and diverticulitis.  Pt signed admission paperwork.  Pt in agreement with plan of care.  RN oriented pt to the staff, the unit, and to pt's room.

## 2021-03-20 DIAGNOSIS — F332 Major depressive disorder, recurrent severe without psychotic features: Secondary | ICD-10-CM

## 2021-03-20 LAB — URINALYSIS, ROUTINE W REFLEX MICROSCOPIC
Bilirubin Urine: NEGATIVE
Glucose, UA: NEGATIVE mg/dL
Hgb urine dipstick: NEGATIVE
Ketones, ur: NEGATIVE mg/dL
Leukocytes,Ua: NEGATIVE
Nitrite: NEGATIVE
Protein, ur: NEGATIVE mg/dL
Specific Gravity, Urine: 1.021 (ref 1.005–1.030)
pH: 7 (ref 5.0–8.0)

## 2021-03-20 MED ORDER — OLANZAPINE 5 MG PO TBDP: 5.0000 mg | ORAL_TABLET | Freq: Three times a day (TID) | ORAL | Status: DC | PRN

## 2021-03-20 MED ORDER — TRAZODONE HCL 50 MG PO TABS
50.0000 mg | ORAL_TABLET | Freq: Every day | ORAL | Status: DC
  Filled 2021-03-20 (×3): qty 1

## 2021-03-20 MED ORDER — ZIPRASIDONE MESYLATE 20 MG IM SOLR: 20.0000 mg | INTRAMUSCULAR | Status: DC | PRN

## 2021-03-20 MED ORDER — ESCITALOPRAM OXALATE 10 MG PO TABS
10.0000 mg | ORAL_TABLET | Freq: Every day | ORAL | Status: DC
  Administered 2021-03-20 – 2021-03-24 (×5): 10 mg via ORAL
  Filled 2021-03-20 (×6): qty 1

## 2021-03-20 MED ORDER — LORAZEPAM 1 MG PO TABS: 1.0000 mg | ORAL_TABLET | ORAL | Status: DC | PRN

## 2021-03-20 NOTE — B[facility] Group Notes (Addendum)
Psychoeducational Group Note    Date:03/20/21 Time: 1300-1400    Purpose of Group: . The group focus' on teaching patients on how to identify their needs and their Life Skills:  A group where two lists are made. What people need and what are things that we do that are unhealthy. The lists are developed by the patients and it is explained that we often do the actions that are not healthy to get our list of needs met.  Goal:: to develop the coping skills needed to get their needs met  Participation Level:  Active  Participation Quality:  Appropriate  Affect:  Appropriate  Cognitive:  Oriented  Insight:  Improving  Engagement in Group:  Engaged  Additional Comments:  Rates his energy at a 7/10. Participated fully in the group.  Paulino Rily

## 2021-03-20 NOTE — Progress Notes (Signed)
°   03/20/21 2251  Psych Admission Type (Psych Patients Only)  Admission Status Voluntary  Psychosocial Assessment  Patient Complaints None  Eye Contact Brief  Facial Expression Flat  Affect Appropriate to circumstance  Speech Logical/coherent  Interaction Minimal  Motor Activity Other (Comment) (WDL)  Appearance/Hygiene Unremarkable  Behavior Characteristics Appropriate to situation  Mood Pleasant  Thought Process  Coherency WDL  Content WDL  Delusions None reported or observed  Perception WDL  Hallucination None reported or observed  Judgment Limited  Confusion None  Danger to Self  Current suicidal ideation? Denies  Self-Injurious Behavior No self-injurious ideation or behavior indicators observed or expressed   Agreement Not to Harm Self Yes  Description of Agreement verbally contracts  Danger to Others  Danger to Others None reported or observed

## 2021-03-20 NOTE — BHH Group Notes (Signed)
B[facility] Group Notes:  (Nursing/MHT/Case Management/Adjunct)  Date:  03/20/2021  Time:  9:29 PM  Type of Therapy:   Wrap-up Group  Participation Level:  Active  Participation Quality:  Appropriate and Attentive  Affect:  Appropriate  Cognitive:  Alert and Appropriate  Insight:  Appropriate, Good, and Improving  Engagement in Group:  Developing/Improving  Modes of Intervention:  Discussion  Summary of Progress/Problems: CT attended wrap-up group and was very active and attentive. CT says he wants to work on himself and discharging so he can get back to his kids.   Lorita Officer 03/20/2021, 9:29 PM

## 2021-03-20 NOTE — B[facility] Group Notes (Signed)
Goals Group 03/20/21    Group Focus: affirmation, clarity of thought, and goals/reality orientation Treatment Modality:  Psychoeducation Interventions utilized were assignment, group exercise, and support Purpose: To be able to understand and verbalize the reason for their admission to the hospital. To understand that the medication helps with their chemical imbalance but they also need to work on their choices in life. To be challenged to develop a list of 30 positives about themselves. Also introduce the concept that "feelings" are not reality.  Participation Level:  Active  Participation Quality:  Appropriate  Affect: Flat  Cognitive:  Appropriate  Insight:  Improving  Engagement in Group:  Engaged  Additional Comments:  Rates his energy at a 7/10.  Dione Housekeeper
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
## 2021-03-20 NOTE — B[facility] Suicide Risk Assessment (Signed)
Suicide Risk Assessment  Admission Assessment    [facility] Admission Suicide Risk Assessment   Nursing information obtained from:  Patient Demographic factors:  Adolescent or young adult Current Mental Status:  NA Loss Factors:  NA Historical Factors:  Prior suicide attempts Risk Reduction Factors:  Sense of responsibility to family, Employed  Total Time spent with patient: 1 hour Principal Problem: MDD (major depressive disorder), recurrent severe, without psychosis ([place]) Diagnosis:  Principal Problem:   MDD (major depressive disorder), recurrent severe, without psychosis ([place]) Active Problems:   Suicidal ideations  History of Present Illness: Earl Mccarthy, 31 y.o. male who walked into the [facility] [place]) with complaints of worsening depression, suicidal ideations with a plan to jump over a bridge. Pt was transferred to this [place] [facility] for treatment and stabilization of his mood.  Continued Clinical Symptoms: Pt currently reports worsening depression, anhedonia, recurrent thoughts of death, feelings of worthlessness, hopelessness, and helplessness, feelings of worthlessness/guilt,  difficulty concentrating. Hospitalization is currently necessary to treat and stabilize patient's mood.  Alcohol Use Disorder Identification Test Final Score (AUDIT): 2 The "Alcohol Use Disorders Identification Test", Guidelines for Use in Primary Care, Second Edition.  World Pharmacologist [facility]). Score between 0-7:  no or Mccarthy risk or alcohol related problems. Score between 8-15:  moderate risk of alcohol related problems. Score between 16-19:  high risk of alcohol related problems. Score 20 or above:  warrants further diagnostic evaluation for alcohol dependence and treatment.  CLINICAL FACTORS:   Depression:   Anhedonia Insomnia  Musculoskeletal: Strength & Muscle Tone: within normal limits Gait & Station: normal Patient leans: N/A  Psychiatric  Specialty Exam:  Presentation  General Appearance: Appropriate for Environment; Fairly Groomed  Eye Contact:Fair  Speech:Clear and Coherent  Speech Volume:Normal  Handedness:Right  Mood and Affect  Mood:Anxious; Depressed; Worthless; Hopeless  Affect:Flat  Thought Process  Thought Processes:Coherent  Descriptions of Associations:Intact  Orientation:Full (Time, Place and Person)  Thought Content:Logical  History of Schizophrenia/Schizoaffective disorder:No  Duration of Psychotic Symptoms:No data recorded Hallucinations:Hallucinations: None  Ideas of Reference:None  Suicidal Thoughts:Suicidal Thoughts: No  Homicidal Thoughts:Homicidal Thoughts: No  Sensorium  Memory:Immediate Good  Judgment:Poor  Insight:Poor  Executive Functions  Concentration:Fair  Attention Span:Fair  [place]  Psychomotor Activity  Psychomotor Activity:Psychomotor Activity: Normal  Assets  Assets:Communication Skills  Sleep  Sleep:Sleep: Poor  Physical Exam: Physical Exam Constitutional:      General: He is not in acute distress.    Appearance: He is not ill-appearing.  HENT:     Head: Normocephalic.     Right Ear: There is no impacted cerumen.     Nose: No congestion or rhinorrhea.     Mouth/Throat:     Pharynx: No posterior oropharyngeal erythema.  Pulmonary:     Effort: No respiratory distress.     Breath sounds: No stridor.  Abdominal:     General: There is no distension.     Palpations: There is no mass.  Musculoskeletal:     Cervical back: No rigidity or tenderness.  Skin:    Coloration: Skin is not jaundiced or pale.  Neurological:     Mental Status: He is alert and oriented to person, place, and time.     Coordination: Coordination normal.  Psychiatric:        Thought Content: Thought content normal.   Review of Systems  Constitutional: Negative.   HENT: Negative.    Eyes: Negative.   Respiratory:  Negative.    Cardiovascular: Negative.   Gastrointestinal: Negative.  Negative for abdominal pain, constipation, diarrhea, heartburn, nausea and vomiting.  Genitourinary: Negative.   Musculoskeletal: Negative.   Skin: Negative.   Neurological: Negative.   Psychiatric/Behavioral:  Positive for depression and substance abuse. The patient is nervous/anxious and has insomnia.   Blood pressure 116/89, pulse (!) 125, temperature 97.7 F (36.5 C), resp. rate 18, height 6\' 4"  (1.93 m), weight (!) 137 kg, SpO2 100 %. Body mass index is 36.76 kg/m.   COGNITIVE FEATURES THAT CONTRIBUTE TO RISK:  None    SUICIDE RISK:   Moderate:  Frequent suicidal ideation with limited intensity, and duration, some specificity in terms of plans, no associated intent, good self-control, limited dysphoria/symptomatology, some risk factors present, and identifiable protective factors, including available and accessible social support.  PLAN Safety and Monitoring: Voluntary admission to inpatient psychiatric unit for safety, stabilization and treatment Daily contact with patient to assess and evaluate symptoms and progress in treatment Patient's case to be discussed in multi-disciplinary team meeting Observation Level : q15 minute checks Vital signs: q12 hours Precautions: suicide   Medications MDD (major depressive disorder), recurrent severe, without psychosis ([place]) -Start Lexapro 10 mg daily   Insomnia -Start Trazodone 50 mg nightly   Anxiety -Start Hydroxyzine 25 mg every 6 hours PRN   Other PRNS -Continue Tylenol 650 mg every 6 hours PRN for mild pain -Continue Maalox 30 mg every 4 hrs PRN for indigestion -Continue Milk of Magnesia as needed every 6 hrs for constipation -Start Agitation protocol: Zyprexa, Ativan, Geodon PRN   Long Term Goal(s): Improvement in symptoms so as ready for discharge   Short Term Goals: Ability to identify changes in lifestyle to reduce recurrence of condition will  improve, Ability to verbalize feelings will improve, Ability to disclose and discuss suicidal ideas, and Ability to demonstrate self-control will improve   Physician Treatment Plan for Secondary Diagnosis:  Principal Problem:   MDD (major depressive disorder), recurrent severe, without psychosis ([place]) Active Problems:   Suicidal ideations     Long Term Goal(s): Improvement in symptoms so as ready for discharge   Short Term Goals: Ability to disclose and discuss suicidal ideas, Ability to demonstrate self-control will improve, Ability to identify and develop effective coping behaviors will improve, Ability to maintain clinical measurements within normal limits will improve, Compliance with prescribed medications will improve, and Ability to identify triggers associated with substance abuse/mental health issues will improve.   Discharge Planning: Social work and case management to assist with discharge planning and identification of hospital follow-up needs prior to discharge Estimated LOS: 5-7 days Discharge Concerns: Need to establish a safety plan; Medication compliance and effectiveness Discharge Goals: Return home with outpatient referrals for mental health follow-up including medication management/psychotherapy  I certify that inpatient services furnished can reasonably be expected to improve the patient's condition.   Nicholes Rough, NP 03/20/2021, 5:20 PM
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
## 2021-03-20 NOTE — Progress Notes (Signed)
Pt denies SI/HI/AVH and verbally agrees to approach staff if these become apparent or before harming themselves/others. Rates depression 8/10. Rates anxiety 8/10. Rates pain 0/10. Pt is very stressed and anxious about what he did and his family. Pt is kind and interacts with others well. Pt wanted to get his girlfriend resources. Pt does not know how he can fix what he has done and or what he can do to better support his girlfriend. Pt did state that the atarax helped a lot with his anxiety. Scheduled medications administered to pt, per MD orders. RN provided support and encouragement to pt. Q15 min safety checks implemented and continued. Pt safe on the unit. RN will continue to monitor and intervene as needed.   03/20/21 0753  Psych Admission Type (Psych Patients Only)  Admission Status Voluntary  Psychosocial Assessment  Patient Complaints Anxiety;Depression;Hopelessness;Worthlessness  Eye Contact Brief  Facial Expression Flat;Sad;Worried  Affect Depressed;Anxious;Sad  Speech Logical/coherent;Soft  Interaction Assertive;Minimal  Motor Activity Other (Comment) (WDL)  Appearance/Hygiene Unremarkable;Improved  Behavior Characteristics Cooperative;Anxious  Mood Anxious;Depressed;Pleasant;Sad  Aggressive Behavior  Effect No apparent injury  Thought Process  Coherency WDL  Content Blaming self  Delusions None reported or observed  Perception WDL  Hallucination None reported or observed  Judgment Limited  Confusion None  Danger to Self  Current suicidal ideation? Denies  Self-Injurious Behavior No self-injurious ideation or behavior indicators observed or expressed   Agreement Not to Harm Self Yes  Description of Agreement verbally contracts  Danger to Others  Danger to Others None reported or observed

## 2021-03-20 NOTE — Group Note (Signed)
Date:  03/20/2021 Time:  9:34 AM  Group Topic/Focus:  Orientation:   The focus of this group is to educate the patient on the purpose and policies of crisis stabilization and provide a format to answer questions about their admission.  The group details unit policies and expectations of patients while admitted.    Participation Level:  Active  Participation Quality:  Appropriate  Affect:  Appropriate  Cognitive:  Appropriate  Insight: Appropriate  Engagement in Group:  Engaged  Modes of Intervention:  Discussion  Additional Comments:  Pt was engaged in group.  Jaquita Rector 03/20/2021, 9:34 AM

## 2021-03-21 LAB — BASIC METABOLIC PANEL
Anion gap: 4 — ABNORMAL LOW (ref 5–15)
BUN: 8 mg/dL (ref 6–20)
CO2: 26 mmol/L (ref 22–32)
Calcium: 9.1 mg/dL (ref 8.9–10.3)
Chloride: 108 mmol/L (ref 98–111)
Creatinine, Ser: 1.27 mg/dL — ABNORMAL HIGH (ref 0.61–1.24)
GFR, Estimated: >60 mL/min (ref >60)
Glucose, Bld: 90 mg/dL (ref 70–99)
Potassium: 4 mmol/L (ref 3.5–5.1)
Sodium: 138 mmol/L (ref 135–145)

## 2021-03-21 LAB — HEPATIC FUNCTION PANEL
ALT: 40 U/L (ref 0–44)
AST: 40 U/L (ref 15–41)
Albumin: 4.6 g/dL (ref 3.5–5.0)
Alkaline Phosphatase: 52 U/L (ref 38–126)
Bilirubin, Direct: <0.1 mg/dL (ref 0.0–0.2)
Total Bilirubin: 0.9 mg/dL (ref 0.3–1.2)
Total Protein: 7.9 g/dL (ref 6.5–8.1)

## 2021-03-21 MED ORDER — TRAZODONE HCL 100 MG PO TABS
100.0000 mg | ORAL_TABLET | Freq: Every day | ORAL | Status: DC
  Administered 2021-03-21: 100 mg via ORAL
  Filled 2021-03-21 (×4): qty 1

## 2021-03-21 NOTE — Group Note (Signed)
°  [facility] LCSW Group Therapy Note  Date/Time:  03/21/2021 10:00AM-11:00AM  Type of Therapy and Topic:  Group Therapy:  Self-Care after Hospitalization  Participation Level:  Active   Description of Group This process group involved patients discussing how they plan to take care of themselves in a better manner when they get home from the hospital.  The group started with patients listing one healthy and one unhealthy way they took care of themselves prior to hospitalization.  A discussion ensued about the differences in healthy and unhealthy coping skills.  Group members shared ideas about making changes when they return home so that they can stay well and in recovery.  The white board was used to list ideas so that patients can continue to see these ideas throughout the day.  Therapeutic Goals Patient will identify and describe one healthy and one unhealthy coping technique used prior to hospitalization Patient will participate in generating ideas about healthy self-care options when they return to the community Patients will be supportive of one another and receive said support from others Patient will identify one healthy self-care activity to add to his/her post-hospitalization life that can help in recovery  Summary of Patient Progress:  The patient expressed that prior to hospitalization some healthy self-care activity he engaged in was kick boxing and MMA, while unhealthy self-care activity included working his self to the ground and taking no vacation time.  Patient's participation in group was beneficial.   Patient identified kick boxing and MMA as the self-care activity he will continue in pursuit of recovery post-discharge.   Therapeutic Modalities Brief Solution-Focused Therapy Motivational Interviewing Psychoeducation      Veva Holes, Theresia Majors 03/21/2021  2:04 PM

## 2021-03-21 NOTE — Progress Notes (Signed)
[facility] MD Progress Note  03/21/2021 1:25 PM Earl Mccarthy  MRN:  [id number]  HPI: Earl Mccarthy, 31 y.o. male who walked into the [facility]) with complaints of worsening depression, suicidal ideations with a plan to jump over a bridge. Pt was transferred to this  health hospital for treatment and stabilization of his mood.  Today's Evaluation-03/21/2021 Pt with euthymic mood and appropriate, attention to personal hygiene and grooming is fair, eye contact is good, speech is clear & coherent. Thought contents are organized and logical, and pt currently denies SI/HI/AVH or paranoia. There is no evidence of delusional thoughts. Pt reports that his sleep quality last night was fair, appetite is good, and he reports feeling "jittery sometimes", but states that it is related to his anxiety. Pt has been educated that he has medication (Vistaril) ordered for anxiety as needed, and that he has to ask his nurse for this medication when he anxiety starts. Pt verbalizes understanding. Pt is visible on the unit, and is interacting with staff and participating in activities.  Pt states that he has always felt through entire childhood and adulthood that he was never wanted by his parents, due to the trauma that he suffered in childhood. Pt reports that as a result, he has always been wanting love, and validation from women, which is what he did not get from his mother. Pt states that he wants to start changing this, and work on loving himself more. Pt states that he has reached out to his girlfriend, but she is still very hurt by his actions. He states that he plans on going to live with his aunt until he can work things out with his girlfriend.  V/S are WNL, AST as elevated at  49 on 1/5, but a repeat hep panel on 1/8 shows AST WNL at 40. CR is 1.27. Will educate pt to follow this up with his outpatient PCP. Pt denies being in any physical pain. Trazodone increased to 100  mg nightly for insomnia.  Principal Problem: MDD (major depressive disorder), single episode, severe ([place]) Diagnosis: Principal Problem:   MDD (major depressive disorder), single episode, severe ([place]) Active Problems:   Suicidal ideations  Total Time spent with patient: 20 minutes  Past Psychiatric History: N/A  Past Medical History: History reviewed. No pertinent past medical history. History reviewed. No pertinent surgical history. Family History:  Family History  Problem Relation Age of Onset   Cancer Mother    Hypertension Father    Family Psychiatric  History: Pt unsure Social History:  Social History   Substance and Sexual Activity  Alcohol Use Not Currently     Social History   Substance and Sexual Activity  Drug Use Yes   Types: Marijuana   Comment: sometimes-socially "a few shots"     Social History   Socioeconomic History   Marital status: Single    Spouse name: Not on file   Number of children: Not on file   Years of education: Not on file   Highest education level: Not on file  Occupational History   Not on file  Tobacco Use   Smoking status: Every Day    Packs/day: 1.00    Years: 0.50    Pack years: 0.50    Types: Cigarettes   Smokeless tobacco: Never  Substance and Sexual Activity   Alcohol use: Not Currently   Drug use: Yes    Types: Marijuana    Comment: sometimes-socially "a few shots"  Sexual activity: Yes    Birth control/protection: Condom  Other Topics Concern   Not on file  Social History Narrative   Not on file   Social Determinants of Health   Financial Resource Strain: Not on file  Food Insecurity: Not on file  Transportation Needs: Not on file  Physical Activity: Not on file  Stress: Not on file  Social Connections: Not on file   Sleep: Fair  Appetite:  Fair  Current Medications: Current Facility-Administered Medications  Medication Dose Route Frequency Provider Last Rate Last Admin   acetaminophen (TYLENOL)  tablet 650 mg  650 mg Oral Q6H PRN White, Patrice L, NP       alum & mag hydroxide-simeth (MAALOX/MYLANTA) 200-200-20 MG/5ML suspension 30 mL  30 mL Oral Q4H PRN White, Patrice L, NP       escitalopram (LEXAPRO) tablet 10 mg  10 mg Oral Daily Nicholes Rough, NP   10 mg at 03/21/21 0736   hydrOXYzine (ATARAX) tablet 25 mg  25 mg Oral TID PRN White, Patrice L, NP   25 mg at 03/20/21 1133   OLANZapine zydis (ZYPREXA) disintegrating tablet 5 mg  5 mg Oral Q8H PRN Nicholes Rough, NP       And   LORazepam (ATIVAN) tablet 1 mg  1 mg Oral PRN Nicholes Rough, NP       And   ziprasidone (GEODON) injection 20 mg  20 mg Intramuscular PRN Silvanna Ohmer, NP       magnesium hydroxide (MILK OF MAGNESIA) suspension 30 mL  30 mL Oral Daily PRN White, Patrice L, NP       nicotine (NICODERM CQ - dosed in mg/24 hours) patch 21 mg  21 mg Transdermal Q0600 White, Patrice L, NP   21 mg at 03/21/21 0734   traZODone (DESYREL) tablet 50 mg  50 mg Oral QHS Nicholes Rough, NP        Lab Results:  Results for orders placed or performed during the hospital encounter of 03/19/21 (from the past 48 hour(s))  Urinalysis, Routine w reflex microscopic     Status: None   Collection Time: 03/20/21  9:29 AM  Result Value Ref Range   Color, Urine YELLOW YELLOW   APPearance CLEAR CLEAR   Specific Gravity, Urine 1.021 1.005 - 1.030   pH 7.0 5.0 - 8.0   Glucose, UA NEGATIVE NEGATIVE mg/dL   Hgb urine dipstick NEGATIVE NEGATIVE   Bilirubin Urine NEGATIVE NEGATIVE   Ketones, ur NEGATIVE NEGATIVE mg/dL   Protein, ur NEGATIVE NEGATIVE mg/dL   Nitrite NEGATIVE NEGATIVE   Leukocytes,Ua NEGATIVE NEGATIVE    Comment: Performed at [facility], [place] [address], [place], [place] [address]  Hepatic function panel     Status: None   Collection Time: 03/21/21  6:21 AM  Result Value Ref Range   Total Protein 7.9 6.5 - 8.1 g/dL   Albumin 4.6 3.5 - 5.0 g/dL   AST 40 15 - 41 U/L   ALT 40 0 - 44 U/L   Alkaline  Phosphatase 52 38 - 126 U/L   Total Bilirubin 0.9 0.3 - 1.2 mg/dL   Bilirubin, Direct <0.1 0.0 - 0.2 mg/dL   Indirect Bilirubin NOT CALCULATED 0.3 - 0.9 mg/dL    Comment: Performed at [facility], [place] [address], [place], [place] [id number]  Basic metabolic panel     Status: Abnormal   Collection Time: 03/21/21  6:23 AM  Result Value Ref Range   Sodium 138  135 - 145 mmol/L   Potassium 4.0 3.5 - 5.1 mmol/L   Chloride 108 98 - 111 mmol/L   CO2 26 22 - 32 mmol/L   Glucose, Bld 90 70 - 99 mg/dL    Comment: Glucose reference range applies only to samples taken after fasting for at least 8 hours.   BUN 8 6 - 20 mg/dL   Creatinine, Ser 1.611.27 (H) 0.61 - 1.24 mg/dL   Calcium 9.1 8.9 - 09.610.3 mg/dL   GFR, Estimated >04>60 >54>60 mL/min    Comment: (NOTE) Calculated using the CKD-EPI Creatinine Equation (2021)    Anion gap 4 (L) 5 - 15    Comment: Performed at [facility], [address] [place], [place] [id number]    Blood Alcohol level:  Lab Results  Component Value Date   [facility] <10 03/18/2021   ETH <11 09/22/2012    Metabolic Disorder Labs: Lab Results  Component Value Date   HGBA1C 5.3 03/18/2021   MPG 105.41 03/18/2021   No results found for: PROLACTIN Lab Results  Component Value Date   CHOL 185 03/18/2021   TRIG 119 03/18/2021   HDL 35 (L) 03/18/2021   CHOLHDL 5.3 03/18/2021   VLDL 24 03/18/2021   LDLCALC 126 (H) 03/18/2021    Physical Findings: AIMS: Facial and Oral Movements Muscles of Facial Expression: None, normal Lips and Perioral Area: None, normal Jaw: None, normal Tongue: None, normal,Extremity Movements Upper (arms, wrists, hands, fingers): None, normal Lower (legs, knees, ankles, toes): None, normal, Trunk Movements Neck, shoulders, hips: None, normal, Overall Severity Severity of abnormal movements (highest score from questions above): None, normal Incapacitation due to abnormal movements: None, normal Patient's awareness  of abnormal movements (rate only patient's report): No Awareness, Dental Status Current problems with teeth and/or dentures?: Yes Does patient usually wear dentures?: No  CIWA:  n/a COWS:  n/a AIMS:0  Musculoskeletal: Strength & Muscle Tone: within normal limits Gait & Station: normal Patient leans: N/A  Psychiatric Specialty Exam:  Presentation  General Appearance: Appropriate for Environment; Fairly Groomed  Eye Contact:Fair  Speech:Clear and Coherent  Speech Volume:Normal  Handedness:Right  Mood and Affect  Mood:Anxious; Depressed; Worthless; Hopeless  Affect:Flat  Thought Process  Thought Processes:Coherent  Descriptions of Associations:Intact  Orientation:Full (Time, Place and Person)  Thought Content:Logical  History of Schizophrenia/Schizoaffective disorder:No  Duration of Psychotic Symptoms:No data recorded Hallucinations:Hallucinations: None  Ideas of Reference:None  Suicidal Thoughts:Suicidal Thoughts: No  Homicidal Thoughts:Homicidal Thoughts: No  Sensorium  Memory:Immediate Good  Judgment:Poor  Insight:Poor  Executive Functions  Concentration:Fair  Attention Span:Fair  Recall:Fair  Fund of Knowledge:Fair  Language:Fair  Psychomotor Activity  Psychomotor Activity:Psychomotor Activity: Normal  Assets  Assets:Communication Skills  Sleep  Sleep:Sleep: Poor  Physical Exam: Physical Exam HENT:     Head: Normocephalic.     Nose: No congestion or rhinorrhea.     Mouth/Throat:     Pharynx: No oropharyngeal exudate or posterior oropharyngeal erythema.  Cardiovascular:     Heart sounds: No murmur heard. Abdominal:     General: There is no distension.  Musculoskeletal:        General: No swelling, tenderness, deformity or signs of injury.     Cervical back: No rigidity.  Skin:    Coloration: Skin is not jaundiced.  Neurological:     Mental Status: He is alert and oriented to person, place, and time.     Cranial Nerves: No  cranial nerve deficit.     Sensory: No sensory deficit.  Motor: No weakness.     Coordination: Coordination normal.     Gait: Gait normal.  Psychiatric:        Thought Content: Thought content normal.   Review of Systems  Constitutional: Negative.   HENT: Negative.  Negative for sore throat.   Eyes: Negative.   Respiratory: Negative.  Negative for cough, sputum production and shortness of breath.   Cardiovascular: Negative.  Negative for chest pain.  Gastrointestinal: Negative.  Negative for abdominal pain, constipation, diarrhea, heartburn, nausea and vomiting.  Genitourinary: Negative.   Musculoskeletal: Negative.  Negative for joint pain and neck pain.  Skin: Negative.   Neurological: Negative.  Negative for dizziness, tingling, tremors, weakness and headaches.  Psychiatric/Behavioral:  Positive for depression and substance abuse. Negative for hallucinations and suicidal ideas. The patient has insomnia. The patient is not nervous/anxious.   Blood pressure 134/86, pulse 78, temperature 98.2 F (36.8 C), temperature source Oral, resp. rate 16, height 6\' 4"  (1.93 m), weight (!) 137 kg, SpO2 99 %. Body mass index is 36.76 kg/m.  Treatment Plan Summary: Daily contact with patient to assess and evaluate symptoms and progress in treatment and Medication management   Observation Level/Precautions:  15 minute checks  Laboratory:  Labs reviewed   Psychotherapy:  Unit Group sessions  Medications:  See [facility]  Consultations:  To be determined   Discharge Concerns:  Safety, medication compliance, mood stability  Estimated LOS: 5-7 days  Other:  N/A    Physician Treatment Plan for Primary Diagnosis: MDD (major depressive disorder), single episode, severe ([place])   PLAN Safety and Monitoring: Voluntary admission to inpatient psychiatric unit for safety, stabilization and treatment Daily contact with patient to assess and evaluate symptoms and progress in treatment Patient's case to be  discussed in multi-disciplinary team meeting Observation Level : q15 minute checks Vital signs: q12 hours Precautions: suicide   Medications MDD (major depressive disorder), single episode severe, without psychosis (HCC)  -Continue Lexapro 10 mg daily   Elevated AST - Rechecking LFTs and hepatitis panel   Chronically elevated creatinine (r/o CKI) - Rechecking BMP and encouragin po hydration-CR-1.27 from 1/8 - Needs PCP f/u after discharge   Insomnia -Increase Trazodone to 100 mg nightly   Anxiety -Start Hydroxyzine 25 mg every 6 hours PRN   Other PRNS -Continue Tylenol 650 mg every 6 hours PRN for mild pain -Continue Maalox 30 mg every 4 hrs PRN for indigestion -Continue Milk of Magnesia as needed every 6 hrs for constipation -Continue Agitation protocol: Zyprexa, Ativan, Geodon PRN   Long Term Goal(s): Improvement in symptoms so as ready for discharge   Short Term Goals: Ability to identify changes in lifestyle to reduce recurrence of condition will improve, Ability to verbalize feelings will improve, Ability to disclose and discuss suicidal ideas, and Ability to demonstrate self-control will improve   Physician Treatment Plan for Secondary Diagnosis:  Principal Problem:   MDD (major depressive disorder), single episode, severe ([place]) Active Problems:   Suicidal ideations    Long Term Goal(s): Improvement in symptoms so as ready for discharge   Short Term Goals: Ability to disclose and discuss suicidal ideas, Ability to demonstrate self-control will improve, Ability to identify and develop effective coping behaviors will improve, Ability to maintain clinical measurements within normal limits will improve, Compliance with prescribed medications will improve, and Ability to identify triggers associated with substance abuse/mental health issues will improve.   Discharge Planning: Social work and case management to assist with discharge planning and identification of hospital  follow-up needs prior to discharge Estimated LOS: 5-7 days Discharge Concerns: Need to establish a safety plan; Medication compliance and effectiveness Discharge Goals: Return home with outpatient referrals for mental health follow-up including medication management/psychotherapy   I certify that inpatient services furnished can reasonably be expected to improve the patient's condition.    Nicholes Rough, NP 03/21/2021, 1:25 PM

## 2021-03-21 NOTE — [facility] Suicide Risk Assessment (Signed)
[facility] INPATIENT:  Family/Significant Other Suicide Prevention Education  Suicide Prevention Education:  Education Completed; girlfriend Barbarann Ehlers [phone],  (name of family member/significant other) has been identified by the patient as the family member/significant other with whom the patient will be residing, and identified as the person(s) who will aid the patient in the event of a mental health crisis (suicidal ideations/suicide attempt).    Girlfriend confirmed that patient's gun is out of the home he has shared with her.  Her own gun is secured in a safe to which the patient does not know the code.  She was happy to take the number for Mobile Crisis.  With written consent from the patient, the family member/significant other has been provided the following suicide prevention education, prior to the and/or following the discharge of the patient.  The suicide prevention education provided includes the following: Suicide risk factors Suicide prevention and interventions National Suicide Hotline telephone number [facility] assessment telephone number [facility] Emergency Assistance 911 [facility] and/or Residential Mobile Crisis Unit telephone number  Request made of family/significant other to: Remove weapons (e.g., guns, rifles, knives), all items previously/currently identified as safety concern.   Remove drugs/medications (over-the-counter, prescriptions, illicit drugs), all items previously/currently identified as a safety concern.  The family member/significant other verbalizes understanding of the suicide prevention education information provided.  The family member/significant other agrees to remove the items of safety concern listed above.  Earl Mccarthy 03/21/2021, 10:45 AM

## 2021-03-21 NOTE — Progress Notes (Addendum)
°  D. Pt presents with an anxious affect/ mood- calm, cooperative behavior- rated his depression, hopelessness and anxiety a 0/0/7, respectively. Pt reported that his goal to work on today was "dealing with my anxiety". Pt has been visible in the dayroom attending groups, and observed interacting appropriately with peers and staff.  Pt currently denies SI/HI and AVH, and does not appear to be responding to internal stimuli. Pt denied having any side effects from medications. A. Labs and vitals monitored. Pt given and educated on medications. Pt supported emotionally and encouraged to express concerns and ask questions.   R. Pt remains safe with 15 minute checks. Will continue POC.   03/21/21 1300  Psych Admission Type (Psych Patients Only)  Admission Status Voluntary  Psychosocial Assessment  Patient Complaints Anxiety  Eye Contact Brief  Facial Expression Flat  Affect Appropriate to circumstance  Speech Logical/coherent  Interaction Minimal  Motor Activity Other (Comment) (WDL)  Appearance/Hygiene Unremarkable  Behavior Characteristics Cooperative;Appropriate to situation  Mood Pleasant;Anxious  Aggressive Behavior  Targets Self  Type of Behavior Verbal  Effect No apparent injury  Thought Process  Coherency WDL  Content WDL  Delusions None reported or observed  Perception WDL  Hallucination None reported or observed  Judgment Limited  Confusion None  Danger to Self  Current suicidal ideation? Denies  Self-Injurious Behavior No self-injurious ideation or behavior indicators observed or expressed   Agreement Not to Harm Self Yes  Description of Agreement verbally contracts  Danger to Others  Danger to Others None reported or observed

## 2021-03-21 NOTE — [facility] Group Notes (Signed)
Psychoeducational Group Note  Date:  03/21/2021 Time:  1300-1400   Group Topic/Focus: This is a continuation of the group from Saturday. Pt's have been asked to formulate a list of 30 positives about themselves. This list is to be read 2 times a day for 30 days, looking in a mirror. Changing patterns of negative self talk. Also discussed is the fact that there have been some people who hurt [place] in the past. We keep that memory alive within [place]. Ways to cope with this are discused   Participation Level:  Active  Participation Quality:  Appropriate  Affect:  Appropriate  Cognitive:  Oriented  Insight: Improving  Engagement in Group:  Engaged  Modes of Intervention:  Activity, Discussion, Education, and Support  Additional Comments:  rates energy level at a 9.5-10. Participated fully in the group.  Paulino Rily

## 2021-03-21 NOTE — [facility] Suicide Risk Assessment (Signed)
[facility] INPATIENT:  Family/Significant Other Suicide Prevention Education  Suicide Prevention Education:  Education Completed;  aunt Lise Auer [phone]),  (name of family member/significant other) has been identified by the patient as the family member/significant other with whom the patient will be residing, and identified as the person(s) who will aid the patient in the event of a mental health crisis (suicidal ideations/suicide attempt).    Aunt visited last night and felt the patient was doing better.  He talked to her about "unpacking" his childhood trauma.  She is aware of that trauma, as she is one of the individuals who rescued him from the trauma.  She stated that the patient is careful to "not want to blame anyone."  She feels it is important for him to be able to acknowledge that bad things were done to him and to be able to lay the responsibility where it belongs.  His biological mother is visiting today and aunt states that mother does not feel she did anything wrong, so is not sure how that visit is going to go.   He expressed he would prefer not to have a visit from mother, but he is allowing her to visit in order to protect mother's feelings.  Aunt has brought FMLA paperwork to be completed for patient's main job at [employer].  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  It was discussed with her that the treatment team advises that he does not have access to weapons until he is psychiatrically stabilized, but that possession of firearms is actually a legal matter.  CSW informed her that he will have to work with his supervisors to determine if he can return to that job.  Aunt has taken the guns away and plans to take them to her brother's home in [place] before patient discharges.  Aunt stated that she has discovered that patient's girlfriend also has guns so those would have to be secured prior to him returning to that home, if in fact  that happens in the future.     With written consent from the patient, the family member/significant other has been provided the following suicide prevention education, prior to the and/or following the discharge of the patient.  The suicide prevention education provided includes the following: Suicide risk factors Suicide prevention and interventions National Suicide Hotline telephone number [facility] assessment telephone number [facility] Emergency Assistance 911 [facility] and/or Residential Mobile Crisis Unit telephone number  Request made of family/significant other to: Remove weapons (e.g., guns, rifles, knives), all items previously/currently identified as safety concern.   Remove drugs/medications (over-the-counter, prescriptions, illicit drugs), all items previously/currently identified as a safety concern.  The family member/significant other verbalizes understanding of the suicide prevention education information provided.  The family member/significant other agrees to remove the items of safety concern listed above.  Carloyn Jaeger Grossman-Orr 03/21/2021, 10:21 AM

## 2021-03-21 NOTE — [facility] Group Notes (Signed)
Adult Psychoeducational Group Not Date:  03/21/2021 Time:  0900-1045 Group Topic/Focus: PROGRESSIVE RELAXATION. A group where deep breathing is taught and tensing and relaxation muscle groups is used. Imagery is used as well.  Pts are asked to imagine 3 pillars that hold them up when they are not able to hold themselves up.  Participation Level:  Active  Participation Quality:  Appropriate  Affect:  Appropriate  Cognitive:  Oriented  Insight: Improving  Engagement in Group:  Engaged  Modes of Intervention:  Activity, Discussion, Education, and Support  Additional Comments:  Pt rates his energy at a 10/10. States his faith, his family and his activities are what help and hold him up.  Dione Housekeeper

## 2021-03-21 NOTE — Progress Notes (Signed)
°   03/21/21 2220  Psych Admission Type (Psych Patients Only)  Admission Status Voluntary  Psychosocial Assessment  Patient Complaints Insomnia  Eye Contact Brief  Facial Expression Flat  Affect Appropriate to circumstance  Speech Logical/coherent  Interaction Minimal  Motor Activity Other (Comment) (WDL)  Appearance/Hygiene Unremarkable  Behavior Characteristics Appropriate to situation  Mood Pleasant  Thought Process  Coherency WDL  Content WDL  Delusions None reported or observed  Perception WDL  Hallucination None reported or observed  Judgment Poor  Confusion None  Danger to Self  Current suicidal ideation? Denies  Self-Injurious Behavior No self-injurious ideation or behavior indicators observed or expressed   Agreement Not to Harm Self Yes  Description of Agreement verbally contracts  Danger to Others  Danger to Others None reported or observed

## 2021-03-22 ENCOUNTER — Encounter (HOSPITAL_COMMUNITY): Payer: Self-pay

## 2021-03-22 LAB — HEPATITIS PANEL, ACUTE
HCV Ab: NONREACTIVE
Hep A IgM: NONREACTIVE
Hep B C IgM: NONREACTIVE
Hepatitis B Surface Ag: NONREACTIVE

## 2021-03-22 NOTE — Plan of Care (Signed)
Nurse discussed anxiety, depression and coping skills with patient.  

## 2021-03-22 NOTE — BH IP Treatment Plan (Signed)
Interdisciplinary Treatment and Diagnostic Plan Update  03/22/2021 Time of Session: 10:00am Gregorio Worley MRN: [id number]  Principal Diagnosis: MDD (major depressive disorder), single episode, severe (HCC)  Secondary Diagnoses: Principal Problem:   MDD (major depressive disorder), single episode, severe (HCC) Active Problems:   Suicidal ideations   Current Medications:  Current Facility-Administered Medications  Medication Dose Route Frequency Provider Last Rate Last Admin   acetaminophen (TYLENOL) tablet 650 mg  650 mg Oral Q6H PRN White, Patrice L, NP       alum & mag hydroxide-simeth (MAALOX/MYLANTA) 200-200-20 MG/5ML suspension 30 mL  30 mL Oral Q4H PRN White, Patrice L, NP       escitalopram (LEXAPRO) tablet 10 mg  10 mg Oral Daily Starleen Blue, NP   10 mg at 03/22/21 1771   hydrOXYzine (ATARAX) tablet 25 mg  25 mg Oral TID PRN White, Patrice L, NP   25 mg at 03/20/21 1133   OLANZapine zydis (ZYPREXA) disintegrating tablet 5 mg  5 mg Oral Q8H PRN Starleen Blue, NP       And   LORazepam (ATIVAN) tablet 1 mg  1 mg Oral PRN Starleen Blue, NP       And   ziprasidone (GEODON) injection 20 mg  20 mg Intramuscular PRN Nkwenti, Doris, NP       magnesium hydroxide (MILK OF MAGNESIA) suspension 30 mL  30 mL Oral Daily PRN White, Patrice L, NP       nicotine (NICODERM CQ - dosed in mg/24 hours) patch 21 mg  21 mg Transdermal Q0600 White, Patrice L, NP   21 mg at 03/22/21 1657   traZODone (DESYREL) tablet 100 mg  100 mg Oral QHS Nkwenti, Doris, NP   100 mg at 03/21/21 2140   PTA Medications: No medications prior to admission.    Patient Stressors: Financial difficulties   Marital or family conflict   Substance abuse    Patient Strengths: Ability for insight  Communication skills  Supportive family/friends   Treatment Modalities: Medication Management, Group therapy, Case management,  1 to 1 session with clinician, Psychoeducation, Recreational therapy.   Physician Treatment  Plan for Primary Diagnosis: MDD (major depressive disorder), single episode, severe (HCC) Long Term Goal(s): Improvement in symptoms so as ready for discharge   Short Term Goals: Ability to disclose and discuss suicidal ideas Ability to demonstrate self-control will improve Ability to identify and develop effective coping behaviors will improve Ability to maintain clinical measurements within normal limits will improve Compliance with prescribed medications will improve Ability to identify triggers associated with substance abuse/mental health issues will improve Ability to identify changes in lifestyle to reduce recurrence of condition will improve Ability to verbalize feelings will improve  Medication Management: Evaluate patient's response, side effects, and tolerance of medication regimen.  Therapeutic Interventions: 1 to 1 sessions, Unit Group sessions and Medication administration.  Evaluation of Outcomes: Progressing  Physician Treatment Plan for Secondary Diagnosis: Principal Problem:   MDD (major depressive disorder), single episode, severe (HCC) Active Problems:   Suicidal ideations  Long Term Goal(s): Improvement in symptoms so as ready for discharge   Short Term Goals: Ability to disclose and discuss suicidal ideas Ability to demonstrate self-control will improve Ability to identify and develop effective coping behaviors will improve Ability to maintain clinical measurements within normal limits will improve Compliance with prescribed medications will improve Ability to identify triggers associated with substance abuse/mental health issues will improve Ability to identify changes in lifestyle to reduce recurrence of condition will improve  Ability to verbalize feelings will improve     Medication Management: Evaluate patient's response, side effects, and tolerance of medication regimen.  Therapeutic Interventions: 1 to 1 sessions, Unit Group sessions and Medication  administration.  Evaluation of Outcomes: Progressing   RN Treatment Plan for Primary Diagnosis: MDD (major depressive disorder), single episode, severe (HCC) Long Term Goal(s): Knowledge of disease and therapeutic regimen to maintain health will improve  Short Term Goals: Ability to remain free from injury will improve, Ability to verbalize frustration and anger appropriately will improve, Ability to demonstrate self-control, Ability to identify and develop effective coping behaviors will improve, and Compliance with prescribed medications will improve  Medication Management: RN will administer medications as ordered by provider, will assess and evaluate patient's response and provide education to patient for prescribed medication. RN will report any adverse and/or side effects to prescribing provider.  Therapeutic Interventions: 1 on 1 counseling sessions, Psychoeducation, Medication administration, Evaluate responses to treatment, Monitor vital signs and CBGs as ordered, Perform/monitor CIWA, COWS, AIMS and Fall Risk screenings as ordered, Perform wound care treatments as ordered.  Evaluation of Outcomes: Progressing   LCSW Treatment Plan for Primary Diagnosis: MDD (major depressive disorder), single episode, severe (HCC) Long Term Goal(s): Safe transition to appropriate next level of care at discharge, Engage patient in therapeutic group addressing interpersonal concerns.  Short Term Goals: Engage patient in aftercare planning with referrals and resources, Increase social support, Increase ability to appropriately verbalize feelings, Increase emotional regulation, Identify triggers associated with mental health/substance abuse issues, and Increase skills for wellness and recovery  Therapeutic Interventions: Assess for all discharge needs, 1 to 1 time with Social worker, Explore available resources and support systems, Assess for adequacy in community support network, Educate family and  significant other(s) on suicide prevention, Complete Psychosocial Assessment, Interpersonal group therapy.  Evaluation of Outcomes: Progressing   Progress in Treatment: Attending groups: Yes. Participating in groups: Yes. Taking medication as prescribed: Yes. Toleration medication: Yes. Family/Significant other contact made: Yes, individual(s) contacted:  with aunt and girlfriend Patient understands diagnosis: Yes. Discussing patient identified problems/goals with staff: Yes. Medical problems stabilized or resolved: Yes. Denies suicidal/homicidal ideation: Yes. Issues/concerns per patient self-inventory: No.   New problem(s) identified: No, Describe:  none  New Short Term/Long Term Goal(s): medication stabilization, elimination of SI thoughts, development of comprehensive mental wellness plan.    Patient Goals: Did not attend  Discharge Plan or Barriers: Pt will be discharged to home with psychiatric and therapy appointments.   Reason for Continuation of Hospitalization: Depression Medication stabilization  Estimated Length of Stay: 1-3 days   Scribe for Treatment Team: Otelia Santee, LCSW 03/22/2021 10:00 AM

## 2021-03-22 NOTE — Group Note (Signed)
Recreation Therapy Group Note   Group Topic:Stress Management  Group Date: 03/22/2021 Start Time: 0930 End Time: 1000 Facilitators: Victorino Sparrow, LRT,CTRS Location: [address] Dayroom   Goal Area(s) Addresses:  Patient will actively participate in stress management techniques presented during session.  Patient will successfully identify benefit of practicing stress management post d/c.   Group Description: Guided Imagery. LRT provided education, instruction, and demonstration on practice of visualization via guided imagery. Patient was asked to participate in the technique introduced during session. LRT debriefed including topics of mindfulness, stress management and specific scenarios each patient could use these techniques. Patients were given suggestions of ways to access scripts post d/c and encouraged to explore Youtube and other apps available on smartphones, tablets, and computers.   Affect/Mood: Appropriate   Participation Level: Active   Participation Quality: Independent   Behavior: Appropriate   Speech/Thought Process: Focused   Insight: Good   Judgement: Good   Modes of Intervention: Script   Patient Response to Interventions:  Engaged   Education Outcome:  Acknowledges education and In group clarification offered    Clinical Observations/Individualized Feedback: Pt attended and participated in group despite the numerous interruptions by staff.     Plan: Continue to engage patient in RT group sessions 2-3x/week.   Victorino Sparrow, LRT,CTRS 03/22/2021 11:50 AM

## 2021-03-22 NOTE — Progress Notes (Addendum)
[facility] MD Progress Note  03/22/2021 12:32 PM Earl Mccarthy  MRN:  [id number]  HPI: Earl Mccarthy, 31 y.o. male who [facility] into the [facility] with complaints of worsening depression, suicidal ideations with a plan to jump over a bridge. Pt was transferred to this [facility] for treatment and stabilization of his mood.  Today's Evaluation-03/22/2021 Pt with euthymic mood and appropriate affect, attention to personal hygiene and grooming is fair, eye contact is good, speech is clear & coherent. Thought contents are organized and logical, and pt currently denies SI/HI/AVH or paranoia. There is no evidence of delusional thoughts. Pt reports that his sleep quality last night was not good partially because he did not have his CPAP machine. Pt has been educated to call his family to drop off his CPAP machine here at [facility]. Pt's RN has been notified of this, and of the need to report to the next shift that the head of pt's bed needs to be elevated so that he can sleep at a 25-30 degree angle. Pt reports a good appetite, and states that the Lexapro is helping with his anxiety. Pt denies being in any physical pain today. Pt is visible on the unit, and is interacting with staff and participating in activities.  Pt states that his girlfriend has stated that she will visit him here at the hospital, and he is happy about it. Pt reports that the groups over the weekend were very helpful because he was able to realize that he has to let go of the anger and resentment that he harbors towards his mother.   BP currently 129/91, HR WNL, will continue to monitor this. AST as elevated at  49 on 1/5, but a repeat hep panel on 1/8 shows AST WNL at 40. CR is 1.27. Pt educated to follow this up with his outpatient PCP. Pt denies being in any physical pain.   Principal Problem: MDD (major depressive disorder), single episode, severe ([place]) Diagnosis: Principal Problem:   MDD  (major depressive disorder), single episode, severe ([place]) Active Problems:   Suicidal ideations  Total Time spent with patient: 20 minutes  Past Psychiatric History: N/A  Past Medical History: History reviewed. No pertinent past medical history. History reviewed. No pertinent surgical history. Family History:  Family History  Problem Relation Age of Onset   Cancer Mother    Hypertension Father    Family Psychiatric  History: Pt unsure Social History:  Social History   Substance and Sexual Activity  Alcohol Use Not Currently     Social History   Substance and Sexual Activity  Drug Use Yes   Types: Marijuana   Comment: sometimes-socially "a few shots"     Social History   Socioeconomic History   Marital status: Single    Spouse name: Not on file   Number of children: Not on file   Years of education: Not on file   Highest education level: Not on file  Occupational History   Not on file  Tobacco Use   Smoking status: Every Day    Packs/day: 1.00    Years: 0.50    Pack years: 0.50    Types: Cigarettes   Smokeless tobacco: Never  Substance and Sexual Activity   Alcohol use: Not Currently   Drug use: Yes    Types: Marijuana    Comment: sometimes-socially "a few shots"    Sexual activity: Yes    Birth control/protection: Condom  Other Topics Concern  Not on file  Social History Narrative   Not on file   Social Determinants of Health   Financial Resource Strain: Not on file  Food Insecurity: Not on file  Transportation Needs: Not on file  Physical Activity: Not on file  Stress: Not on file  Social Connections: Not on file   Sleep: Fair  Appetite:  Fair  Current Medications: Current Facility-Administered Medications  Medication Dose Route Frequency Provider Last Rate Last Admin   acetaminophen (TYLENOL) tablet 650 mg  650 mg Oral Q6H PRN White, Patrice L, NP       alum & mag hydroxide-simeth (MAALOX/MYLANTA) 200-200-20 MG/5ML suspension 30 mL  30 mL  Oral Q4H PRN White, Patrice L, NP       escitalopram (LEXAPRO) tablet 10 mg  10 mg Oral Daily Nicholes Rough, NP   10 mg at 03/22/21 [id number]   hydrOXYzine (ATARAX) tablet 25 mg  25 mg Oral TID PRN White, Patrice L, NP   25 mg at 03/20/21 1133   OLANZapine zydis (ZYPREXA) disintegrating tablet 5 mg  5 mg Oral Q8H PRN Nicholes Rough, NP       And   LORazepam (ATIVAN) tablet 1 mg  1 mg Oral PRN Nicholes Rough, NP       And   ziprasidone (GEODON) injection 20 mg  20 mg Intramuscular PRN Nkwenti, Doris, NP       magnesium hydroxide (MILK OF MAGNESIA) suspension 30 mL  30 mL Oral Daily PRN White, Patrice L, NP       nicotine (NICODERM CQ - dosed in mg/24 hours) patch 21 mg  21 mg Transdermal Q0600 White, Patrice L, NP   21 mg at 03/22/21 [id number]   traZODone (DESYREL) tablet 100 mg  100 mg Oral QHS Nicholes Rough, NP   100 mg at 03/21/21 2140    Lab Results:  Results for orders placed or performed during the hospital encounter of 03/19/21 (from the past 48 hour(s))  Hepatic function panel     Status: None   Collection Time: 03/21/21  6:21 AM  Result Value Ref Range   Total Protein 7.9 6.5 - 8.1 g/dL   Albumin 4.6 3.5 - 5.0 g/dL   AST 40 15 - 41 U/L   ALT 40 0 - 44 U/L   Alkaline Phosphatase 52 38 - 126 U/L   Total Bilirubin 0.9 0.3 - 1.2 mg/dL   Bilirubin, Direct <0.1 0.0 - 0.2 mg/dL   Indirect Bilirubin NOT CALCULATED 0.3 - 0.9 mg/dL    Comment: Performed at [facility], [place] [address], [place], [place] [id number]  Basic metabolic panel     Status: Abnormal   Collection Time: 03/21/21  6:23 AM  Result Value Ref Range   Sodium 138 135 - 145 mmol/L   Potassium 4.0 3.5 - 5.1 mmol/L   Chloride 108 98 - 111 mmol/L   CO2 26 22 - 32 mmol/L   Glucose, Bld 90 70 - 99 mg/dL    Comment: Glucose reference range applies only to samples taken after fasting for at least 8 hours.   BUN 8 6 - 20 mg/dL   Creatinine, Ser 1.27 (H) 0.61 - 1.24 mg/dL   Calcium 9.1 8.9 - 10.3 mg/dL   GFR,  Estimated >60 >60 mL/min    Comment: (NOTE) Calculated using the CKD-EPI Creatinine Equation (2021)    Anion gap 4 (L) 5 - 15    Comment: Performed at [facility], [address]  [place], [place] [address]    Blood Alcohol level:  Lab Results  Component Value Date   [facility] <10 03/18/2021   ETH <11 [id number]    Metabolic Disorder Labs: Lab Results  Component Value Date   HGBA1C 5.3 03/18/2021   MPG 105.41 03/18/2021   No results found for: PROLACTIN Lab Results  Component Value Date   CHOL 185 03/18/2021   TRIG 119 03/18/2021   HDL 35 (L) 03/18/2021   CHOLHDL 5.3 03/18/2021   VLDL 24 03/18/2021   LDLCALC 126 (H) 03/18/2021   Physical Findings: AIMS: Facial and Oral Movements Muscles of Facial Expression: None, normal Lips and Perioral Area: None, normal Jaw: None, normal Tongue: None, normal,Extremity Movements Upper (arms, wrists, hands, fingers): None, normal Lower (legs, knees, ankles, toes): None, normal, Trunk Movements Neck, shoulders, hips: None, normal, Overall Severity Severity of abnormal movements (highest score from questions above): None, normal Incapacitation due to abnormal movements: None, normal Patient's awareness of abnormal movements (rate only patient's report): No Awareness, Dental Status Current problems with teeth and/or dentures?: Yes Does patient usually wear dentures?: No  CIWA:  n/a COWS:  n/a AIMS:0  Musculoskeletal: Strength & Muscle Tone: within normal limits Gait & Station: normal Patient leans: N/A  Psychiatric Specialty Exam:  Presentation  General Appearance: Appropriate for Environment; Fairly Groomed  Eye Contact:Fair  Speech:Clear and Coherent  Speech Volume:Normal  Handedness:Right  Mood and Affect  Mood:Depressed (reports getting better)  Affect:Appropriate  Thought Process  Thought Processes:Coherent  Descriptions of Associations:Intact  Orientation:Full (Time, Place and  Person)  Thought Content:Logical  History of Schizophrenia/Schizoaffective disorder:No  Duration of Psychotic Symptoms:No data recorded Hallucinations:Hallucinations: None  Ideas of Reference:None  Suicidal Thoughts:Suicidal Thoughts: No  Homicidal Thoughts:Homicidal Thoughts: No  Sensorium  Memory:Immediate Good  Judgment:Fair  Insight:Fair  Executive Functions  Concentration:Good  Attention Span:Good  [place] of Knowledge:Good  Language:Good  Psychomotor Activity  Psychomotor Activity:Psychomotor Activity: Normal  Assets  Assets:Communication Skills; Housing; Desire for Improvement  Sleep  Sleep:Sleep: Fair  Physical Exam: Physical Exam HENT:     Head: Normocephalic.     Nose: No congestion or rhinorrhea.     Mouth/Throat:     Pharynx: No oropharyngeal exudate or posterior oropharyngeal erythema.  Cardiovascular:     Heart sounds: No murmur heard. Abdominal:     General: There is no distension.  Musculoskeletal:        General: No swelling, tenderness, deformity or signs of injury.     Cervical back: No rigidity.  Skin:    Coloration: Skin is not jaundiced.  Neurological:     Mental Status: He is alert and oriented to person, place, and time.     Cranial Nerves: No cranial nerve deficit.     Sensory: No sensory deficit.     Motor: No weakness.     Coordination: Coordination normal.     Gait: Gait normal.  Psychiatric:        Thought Content: Thought content normal.   Review of Systems  Constitutional: Negative.   HENT: Negative.  Negative for sore throat.   Eyes: Negative.   Respiratory: Negative.  Negative for cough, sputum production and shortness of breath.   Cardiovascular: Negative.  Negative for chest pain.  Gastrointestinal: Negative.  Negative for abdominal pain, constipation, diarrhea, heartburn, nausea and vomiting.  Genitourinary: Negative.   Musculoskeletal: Negative.  Negative for joint pain and neck pain.  Skin:  Negative.   Neurological: Negative.  Negative for dizziness, tingling, tremors, weakness and headaches.  Psychiatric/Behavioral:  Positive for depression and substance abuse. Negative for hallucinations and suicidal ideas. The patient has insomnia. The patient is not nervous/anxious.   Blood pressure (!) 129/91, pulse 83, temperature 97.8 F (36.6 C), resp. rate 18, height 6\' 4"  (1.93 m), weight (!) 137 kg, SpO2 100 %. Body mass index is 36.76 kg/m.  Treatment Plan Summary: Daily contact with patient to assess and evaluate symptoms and progress in treatment and Medication management   Observation Level/Precautions:  15 minute checks  Laboratory:  Labs reviewed   Psychotherapy:  Unit Group sessions  Medications:  See [facility]  Consultations:  To be determined   Discharge Concerns:  Safety, medication compliance, mood stability  Estimated LOS: 5-7 days  Other:  N/A    Physician Treatment Plan for Primary Diagnosis: MDD (major depressive disorder), single episode, severe ([place])   PLAN Safety and Monitoring: Voluntary admission to inpatient psychiatric unit for safety, stabilization and treatment Daily contact with patient to assess and evaluate symptoms and progress in treatment Patient's case to be discussed in multi-disciplinary team meeting Observation Level : q15 minute checks Vital signs: q12 hours Precautions: suicide   Medications MDD (major depressive disorder), single episode severe, without psychosis (HCC)  -Continue Lexapro 10 mg daily   Elevated AST- resolved -Hepatitis panel negative; repeat LFTS normalized (AST 40 and ALT 40)   Chronically elevated creatinine (r/o CKI) - BMP rechecked and encouragin po hydration-CR-1.27 from 1/8 - Needs PCP f/u after discharge-Educated on this   Insomnia -Continue Trazodone to 100 mg nightly   Anxiety -Start Hydroxyzine 25 mg every 6 hours PRN   Other PRNS -Continue Tylenol 650 mg every 6 hours PRN for mild pain -Continue Maalox  30 mg every 4 hrs PRN for indigestion -Continue Milk of Magnesia as needed every 6 hrs for constipation -Continue Agitation protocol: Zyprexa, Ativan, Geodon PRN   Long Term Goal(s): Improvement in symptoms so as ready for discharge   Short Term Goals: Ability to identify changes in lifestyle to reduce recurrence of condition will improve, Ability to verbalize feelings will improve, Ability to disclose and discuss suicidal ideas, and Ability to demonstrate self-control will improve   Physician Treatment Plan for Secondary Diagnosis:  Principal Problem:   MDD (major depressive disorder), single episode, severe ([place]) Active Problems:   Suicidal ideations    Long Term Goal(s): Improvement in symptoms so as ready for discharge   Short Term Goals: Ability to disclose and discuss suicidal ideas, Ability to demonstrate self-control will improve, Ability to identify and develop effective coping behaviors will improve, Ability to maintain clinical measurements within normal limits will improve, Compliance with prescribed medications will improve, and Ability to identify triggers associated with substance abuse/mental health issues will improve.   Discharge Planning: Social work and case management to assist with discharge planning and identification of hospital follow-up needs prior to discharge Estimated LOS: 5-7 days Discharge Concerns: Need to establish a safety plan; Medication compliance and effectiveness Discharge Goals: Return home with outpatient referrals for mental health follow-up including medication management/psychotherapy   I certify that inpatient services furnished can reasonably be expected to improve the patient's condition.    Nicholes Rough, NP 03/22/2021, 12:32 PM Patient ID: Earl Mccarthy, male   DOB: 1990/06/16, 31 y.o.   MRN: [id number]

## 2021-03-22 NOTE — Progress Notes (Signed)
The patient attended the evening A.A.meeting and was appropriate.  

## 2021-03-22 NOTE — Progress Notes (Addendum)
D:  Patient's self inventory sheet, patient sleeps good, sleep medication is helpful.  Good appetite, normal energy level, good concentration.  Denied depression.  Rated hopeless and anxiety #6.  Denied withdrawals.  Denied SI.  Denied physical problems.  Denied physical pain.  Goal is figure out discharge plan.   Will continue to attend groups.   Does have discharge plans. A:  Medications administered per MD orders.  Emotional support and encouragement given patient. R:  Safety maintained with 15 minute checks.  Patient denied SI and HI, contracts for safety.  Denied A/'V hallucinations.

## 2021-03-22 NOTE — Progress Notes (Addendum)
°   03/22/21 1950  Psych Admission Type (Psych Patients Only)  Admission Status Voluntary  Psychosocial Assessment  Patient Complaints Anxiety (states it is good anxiety 10/10 - nervous about seeing girlfriend tonight at visitation.)  Eye Contact Brief  Facial Expression Anxious  Affect Appropriate to circumstance  Speech Logical/coherent  Interaction Assertive  Motor Activity Other (Comment) (wnl)  Appearance/Hygiene Unremarkable  Behavior Characteristics Cooperative;Anxious  Mood Anxious;Pleasant  Thought Process  Coherency WDL  Content WDL  Delusions None reported or observed  Perception WDL  Hallucination None reported or observed  Judgment WDL  Confusion None  Danger to Self  Current suicidal ideation? Denies  Danger to Others  Danger to Others None reported or observed   Pt seen interacting in the milieu. Pt denies SI, HI, AVH. Rates pain 7/10 in his lower back d/t the bed being uncomfortable.Pt denies depression and rates anxiety 10/10 as "good anxiety." "I was afraid my girlfriend wouldn't come today (for visitation) and that she would hate me but that's not what happened. We talked. I don't want to hurt her anymore. I want [place] to be together. I know I have to give her her space right now. I'm gonna stay with my aunt for a while after discharge. She's like my mother. My mom and I don't have the best relationship but we are working on it. She has cancer so we don't have a lot of time." Pt states that he just wants to stop the generational curses so he can be a better person for his children. Pt states that he is excited to put into practice the tools that he has learned here to cope with his feelings such as positive self talk, going to therapy and exploring where his unhealthy habits come from. "When my girlfriend is ready, I'm up for couples counseling because she needs counseling too. She deserves that."  Pt girlfriend brought his CPAP in this evening.  Pt HOB raised as well. Pt  refused trazodone because it made him too tired this morning. Pt hopes not to need it tonight since he has his CPAP.

## 2021-03-22 NOTE — Group Note (Signed)
LCSW Group Therapy Note   Group Date: 03/22/2021 Start Time: 1300 End Time: 1400  LCSW Aftercare Discharge Planning Group Note  Type of Group and Topic: Psychoeducational Group: Discharge Planning  Participation Level: Active  Description of Group: Discharge planning group reviews patients anticipated discharge plans and assists patients to anticipate and address any barriers to wellness/recovery in the community. Suicide prevention education is reviewed with patients in group.  Therapeutic Goals: 1. Patients will state their anticipated discharge plan and mental health aftercare 2. Patients will identify potential barriers to wellness in the community setting 3. Patients will engage in problem solving, solution focused discussion of ways to anticipate and address barriers to wellness/recovery  Summary of Patient Progress: Pt shared during introductions that what makes him nervous about discharge is mediating his family. Pt was appropriate and participated during discussion.   Therapeutic Modalities: Motivational Interviewing    Felizardo Hoffmann, Theresia Majors 03/22/2021  1:54 PM

## 2021-03-23 MED ORDER — GABAPENTIN 100 MG PO CAPS
100.0000 mg | ORAL_CAPSULE | Freq: Three times a day (TID) | ORAL | Status: DC
  Administered 2021-03-23 – 2021-03-24 (×3): 100 mg via ORAL
  Filled 2021-03-23 (×6): qty 1

## 2021-03-23 NOTE — Progress Notes (Signed)
[facility] MD Progress Note  03/23/2021 12:12 PM Earl Mccarthy  MRN:  [id number]  HPI: Earl Mccarthy, 31 y.o. male who walked into the [facility]) with complaints of worsening depression, suicidal ideations with a plan to jump over a bridge. Pt was transferred to this [facility] for treatment and stabilization of his mood.  Today's Evaluation-03/23/2021 Pt with euthymic mood and appropriate affect, attention to personal hygiene and grooming is fair, eye contact is good, speech is clear & coherent. Thought contents are organized and logical, and pt currently denies SI/HI/AVH or paranoia. There is no evidence of delusional thoughts. Pt reports that his sleep quality last night good because his family dropped off his CPAP machine and he was able to use it, and that the head of his bed was also elevated which helped him get a good night's sleep.  Pt reports a poor appetite, and states this is because he does not eat the food choices being offered because he is does not eat beef or meat products. Pt educated that he can ask for salads/sandwiches if he does not like foods being offered. Pt reports lower back pain of 7 (10 being worst), states that the pain starts in his hip area and shoots towards the back.. Pt is visible on the unit, and is interacting with staff and participating in activities.  Pt states that he received a visit from his girlfriend last night. He states that it was emotional, and that they are in the process of working on their relationship. 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 Pt stated that his job with [employer] pays him well, but it is not enough for everything since his girlfriend does not work, he has a $700/month  car note, a $1200/month rent, and a $400/month child support  payment.   V/S WNL, AST as elevated at  49 on 1/5, but a repeat hep panel on 1/8 shows AST WNL at 40. CR is 1.27. Pt educated to follow this up with his outpatient PCP. Will continue Lexapro 10 mg daily, will add Neurontin 100 mg TID for pain and anxiety since pt complained of anxiety of 5 (10 being worst). Pt currently denies having any other concerns, and verbalizes readiness to be discharged tomorrow.   Principal Problem: MDD (major depressive disorder), single episode, severe ([place]) Diagnosis: Principal Problem:   MDD (major depressive disorder), single episode, severe ([place]) Active Problems:   Suicidal ideations  Total Time spent with patient: 20 minutes  Past Psychiatric History: N/A  Past Medical History: History reviewed. No pertinent past medical history. History reviewed. No pertinent surgical history. Family History:  Family History  Problem Relation Age of Onset   Cancer Mother    Hypertension Father    Family Psychiatric  History: Pt unsure Social History:  Social History   Substance and Sexual Activity  Alcohol Use Not Currently     Social History   Substance and Sexual Activity  Drug Use Yes   Types: Marijuana   Comment: sometimes-socially "a few shots"     Social History   Socioeconomic History   Marital status: Single    Spouse name: Not on file   Number of children: Not on file   Years of education: Not on file   Highest education level: Not on  file  Occupational History   Not on file  Tobacco Use   Smoking status: Every Day    Packs/day: 1.00    Years: 0.50    Pack years: 0.50    Types: Cigarettes   Smokeless tobacco: Never  Substance and Sexual Activity   Alcohol use: Not Currently   Drug use: Yes    Types: Marijuana    Comment: sometimes-socially "a few shots"    Sexual activity: Yes    Birth control/protection: Condom  Other Topics Concern   Not on file  Social History Narrative   Not on file   Social Determinants of Health    Financial Resource Strain: Not on file  Food Insecurity: Not on file  Transportation Needs: Not on file  Physical Activity: Not on file  Stress: Not on file  Social Connections: Not on file   Sleep: Fair  Appetite:  Fair  Current Medications: Current Mccarthy-Administered Medications  Medication Dose Route Frequency Provider Last Rate Last Admin   acetaminophen (TYLENOL) tablet 650 mg  650 mg Oral Q6H PRN White, Patrice L, NP   650 mg at 03/23/21 1008   alum & mag hydroxide-simeth (MAALOX/MYLANTA) 200-200-20 MG/5ML suspension 30 mL  30 mL Oral Q4H PRN White, Patrice L, NP       escitalopram (LEXAPRO) tablet 10 mg  10 mg Oral Daily Nicholes Rough, NP   10 mg at 03/23/21 0754   gabapentin (NEURONTIN) capsule 100 mg  100 mg Oral TID Nicholes Rough, NP       hydrOXYzine (ATARAX) tablet 25 mg  25 mg Oral TID PRN White, Patrice L, NP   25 mg at 03/23/21 1008   OLANZapine zydis (ZYPREXA) disintegrating tablet 5 mg  5 mg Oral Q8H PRN Copeland Lapier, NP       And   LORazepam (ATIVAN) tablet 1 mg  1 mg Oral PRN Nicholes Rough, NP       And   ziprasidone (GEODON) injection 20 mg  20 mg Intramuscular PRN Tikesha Mort, NP       magnesium hydroxide (MILK OF MAGNESIA) suspension 30 mL  30 mL Oral Daily PRN White, Patrice L, NP       nicotine (NICODERM CQ - dosed in mg/24 hours) patch 21 mg  21 mg Transdermal Q0600 White, Patrice L, NP   21 mg at 03/23/21 0755   traZODone (DESYREL) tablet 100 mg  100 mg Oral QHS Nicholes Rough, NP   100 mg at 03/21/21 2140    Lab Results:  Results for orders placed or performed during the hospital encounter of 03/19/21 (from the past 48 hour(s))  Hepatitis panel, acute     Status: None   Collection Time: 03/21/21  6:31 PM  Result Value Ref Range   Hepatitis B Surface Ag NON REACTIVE NON REACTIVE   HCV Ab NON REACTIVE NON REACTIVE    Comment: (NOTE) Nonreactive HCV antibody screen is consistent with no HCV infections,  unless recent infection is suspected  or other evidence exists to indicate HCV infection.     Hep A IgM NON REACTIVE NON REACTIVE   Hep B C IgM NON REACTIVE NON REACTIVE    Comment: Performed at [facility], [place] [address], [place], [place] [address]    Blood Alcohol level:  Lab Results  Component Value Date   [facility] <10 03/18/2021   ETH <11 123XX123    Metabolic Disorder Labs: Lab Results  Component Value Date   HGBA1C 5.3 03/18/2021  MPG 105.41 03/18/2021   No results found for: PROLACTIN Lab Results  Component Value Date   CHOL 185 03/18/2021   TRIG 119 03/18/2021   HDL 35 (L) 03/18/2021   CHOLHDL 5.3 03/18/2021   VLDL 24 03/18/2021   LDLCALC 126 (H) 03/18/2021   Physical Findings: AIMS: Facial and Oral Movements Muscles of Facial Expression: None, normal Lips and Perioral Area: None, normal Jaw: None, normal Tongue: None, normal,Extremity Movements Upper (arms, wrists, hands, fingers): None, normal Lower (legs, knees, ankles, toes): None, normal, Trunk Movements Neck, shoulders, hips: None, normal, Overall Severity Severity of abnormal movements (highest score from questions above): None, normal Incapacitation due to abnormal movements: None, normal Patient's awareness of abnormal movements (rate only patient's report): No Awareness, Dental Status Current problems with teeth and/or dentures?: Yes Does patient usually wear dentures?: No  CIWA:  n/a COWS:  n/a AIMS:0  Musculoskeletal: Strength & Muscle Tone: within normal limits Gait & Station: normal Patient leans: N/A  Psychiatric Specialty Exam:  Presentation  General Appearance: Appropriate for Environment; Fairly Groomed  Eye Contact:Fair  Speech:Clear and Coherent  Speech Volume:Normal  Handedness:Right  Mood and Affect  Mood:Euthymic  Affect:Appropriate  Thought Process  Thought Processes:Coherent  Descriptions of Associations:Intact  Orientation:Full (Time, Place and Person)  Thought  Content:Logical  History of Schizophrenia/Schizoaffective disorder:No  Duration of Psychotic Symptoms:No data recorded Hallucinations:Hallucinations: None  Ideas of Reference:None  Suicidal Thoughts:Suicidal Thoughts: No  Homicidal Thoughts:Homicidal Thoughts: No  Sensorium  Memory:Immediate Good  Judgment:Good  Insight:Good  Executive Functions  Concentration:Good  Attention Span:Good  [place] of Knowledge:Good  Language:Good  Psychomotor Activity  Psychomotor Activity:Psychomotor Activity: Normal  Assets  Assets:Communication Skills; Desire for Improvement; Housing  Sleep  Sleep:Sleep: Good  Physical Exam: Physical Exam HENT:     Head: Normocephalic.     Nose: No congestion or rhinorrhea.     Mouth/Throat:     Pharynx: No oropharyngeal exudate or posterior oropharyngeal erythema.  Cardiovascular:     Heart sounds: No murmur heard. Abdominal:     General: There is no distension.  Musculoskeletal:        General: No swelling, tenderness, deformity or signs of injury.     Cervical back: No rigidity.  Skin:    Coloration: Skin is not jaundiced.  Neurological:     Mental Status: He is alert and oriented to person, place, and time.     Cranial Nerves: No cranial nerve deficit.     Sensory: No sensory deficit.     Motor: No weakness.     Coordination: Coordination normal.     Gait: Gait normal.  Psychiatric:        Thought Content: Thought content normal.   Review of Systems  Constitutional: Negative.   HENT: Negative.  Negative for sore throat.   Eyes: Negative.   Respiratory: Negative.  Negative for cough, sputum production and shortness of breath.   Cardiovascular: Negative.  Negative for chest pain.  Gastrointestinal: Negative.  Negative for abdominal pain, constipation, diarrhea, heartburn, nausea and vomiting.  Genitourinary: Negative.   Musculoskeletal: Negative.  Negative for joint pain and neck pain.  Skin: Negative.    Neurological: Negative.  Negative for dizziness, tingling, tremors, weakness and headaches.  Psychiatric/Behavioral:  Positive for depression and substance abuse. Negative for hallucinations and suicidal ideas. The patient has insomnia. The patient is not nervous/anxious.   Blood pressure 110/75, pulse 76, temperature 97.8 F (36.6 C), resp. rate 18, height 6\' 4"  (1.93 m), weight (!) 137 kg,  SpO2 99 %. Body mass index is 36.76 kg/m.  Treatment Plan Summary: Daily contact with patient to assess and evaluate symptoms and progress in treatment and Medication management   Observation Level/Precautions:  15 minute checks  Laboratory:  Labs reviewed   Psychotherapy:  Unit Group sessions  Medications:  See [facility]  Consultations:  To be determined   Discharge Concerns:  Safety, medication compliance, mood stability  Estimated LOS: 5-7 days  Other:  N/A    Physician Treatment Plan for Primary Diagnosis: MDD (major depressive disorder), single episode, severe ([place])   PLAN Safety and Monitoring: Voluntary admission to inpatient psychiatric unit for safety, stabilization and treatment Daily contact with patient to assess and evaluate symptoms and progress in treatment Patient's case to be discussed in multi-disciplinary team meeting Observation Level : q15 minute checks Vital signs: q12 hours Precautions: suicide   Medications MDD (major depressive disorder), single episode severe, without psychosis (HCC)  -Continue Lexapro 10 mg daily   Elevated AST- resolved -Hepatitis panel negative; repeat LFTS normalized (AST 40 and ALT 40)   Chronically elevated creatinine (r/o CKI) - BMP rechecked and encouragin po hydration-CR-1.27 from 1/8 - Needs PCP f/u after discharge-Educated on this   Insomnia -Continue Trazodone to 100 mg nightly   Anxiety -Continue Hydroxyzine 25 mg every 6 hours PRN  -Start Neurontin 100 mg TID for pain & anxiety  Other PRNS -Continue Tylenol 650 mg every 6 hours  PRN for mild pain -Continue Maalox 30 mg every 4 hrs PRN for indigestion -Continue Milk of Magnesia as needed every 6 hrs for constipation -Continue Agitation protocol: Zyprexa, Ativan, Geodon PRN   Long Term Goal(s): Improvement in symptoms so as ready for discharge   Short Term Goals: Ability to identify changes in lifestyle to reduce recurrence of condition will improve, Ability to verbalize feelings will improve, Ability to disclose and discuss suicidal ideas, and Ability to demonstrate self-control will improve   Physician Treatment Plan for Secondary Diagnosis:  Principal Problem:   MDD (major depressive disorder), single episode, severe ([place]) Active Problems:   Suicidal ideations    Long Term Goal(s): Improvement in symptoms so as ready for discharge   Short Term Goals: Ability to disclose and discuss suicidal ideas, Ability to demonstrate self-control will improve, Ability to identify and develop effective coping behaviors will improve, Ability to maintain clinical measurements within normal limits will improve, Compliance with prescribed medications will improve, and Ability to identify triggers associated with substance abuse/mental health issues will improve.   Discharge Planning: Social work and case management to assist with discharge planning and identification of hospital follow-up needs prior to discharge Estimated LOS: 5-7 days Discharge Concerns: Need to establish a safety plan; Medication compliance and effectiveness Discharge Goals: Return home with outpatient referrals for mental health follow-up including medication management/psychotherapy   I certify that inpatient services furnished can reasonably be expected to improve the patient's condition.    Nicholes Rough, NP 03/23/2021, 12:12 PM Patient ID: Earl Mccarthy, male   DOB: Dec 23, 1990, 31 y.o.   MRN: [id number]

## 2021-03-23 NOTE — Group Note (Addendum)
Recreation Therapy Group Note   Group Topic:Animal Assisted Therapy   Group Date: 03/23/2021 Start Time: 1430 End Time: 1510 Facilitators: Caroll Rancher, LRT,CTRS Location: [address] Dayroom   Animal-Assisted Activity (AAA) Program Checklist/Progress Note Patient Eligibility Criteria Checklist & Daily Group note for Rec Tx Intervention  AAA/T Program Assumption of Risk Form signed by Patient/ or Parent Legal Guardian YES  Patient is free of allergies or severe asthma  YES  Patient reports no fear of animals YES  Patient reports no history of cruelty to animals YES  Patient understands their participation is voluntary YES  Patient washes hands before animal contact YES  Patient washes hands after animal contact YES   Group Description: Patients provided opportunity to interact with trained and credentialed [employer] Therapy dog and the community volunteer/dog handler. Patients practiced appropriate animal interaction and were educated on dog safety outside of the hospital in common community settings. Patients were allowed to use dog toys and other items to practice commands, engage the dog in play, and/or complete routine aspects of animal care.   Education: Charity fundraiser, Health visitor, Communication & Social Skills   Group Description: Patients provided opportunity to interact with trained and credentialed [employer] Therapy dog and the community volunteer/dog handler. Patients practiced appropriate animal interaction and were educated on dog safety outside of the hospital in common community settings. Patients were allowed to use dog toys and other items to practice commands, engage the dog in play, and/or complete routine aspects of animal care.   Education: Charity fundraiser, Health visitor, Communication & Social Skills   Affect/Mood: Appropriate   Participation Level: Engaged   Participation Quality: Independent    Clinical  Observations/Individualized Feedback: Pt was appropriate in their participation in group session by asking appropriate questions of volunteer and sharing stories regarding personal pets.    Plan: Continue to engage patient in RT group sessions 2-3x/week.   Caroll Rancher, LRT,CTRS 03/23/2021 4:12 PM

## 2021-03-23 NOTE — Plan of Care (Signed)
Nurse discussed coping skills with patient.  

## 2021-03-23 NOTE — Progress Notes (Addendum)
D:  Patient denied SI and HI, contracts for safety.  Denied A/V hallucinations. A:  Medications administered per MD orders.  Emotional support and encouragement given patient. R:  Safety maintained with 15 minute checks.  Self inventory sheet, patient  stated he sleeps good, fair appetite, normal energy level, good concentration.  Denied withdrawals.  Denied SI.  Denied physical problems.  Stated she has back pain.  Plans to talk to MD and SW.  Does have discharge plans.

## 2021-03-23 NOTE — [facility] Group Notes (Signed)
[facility] Group Notes:  (Nursing/MHT/Case Management/Adjunct)  Date:  03/23/2021  Time:  9:53 AM  Type of Therapy:  Group Therapy  Participation Level:  Active  Participation Quality:  Appropriate  Affect:  Appropriate  Cognitive:  Appropriate  Insight:  Appropriate  Engagement in Group:  Engaged  Modes of Intervention:  Discussion  Summary of Progress/Problems: patient was active and shared his goals for discharge.   Reymundo Poll 03/23/2021, 9:53 AM

## 2021-03-23 NOTE — [facility] Group Notes (Signed)
Adult Psychoeducational Group Note  Date:  03/23/2021 Time:  10:51 PM  Group Topic/Focus:  Coping With Mental Health Crisis:   The purpose of this group is to help patients identify strategies for coping with mental health crisis.  Group discusses possible causes of crisis and ways to manage them effectively.  Participation Level:  Active  Participation Quality:  Appropriate and Attentive  Affect:  Appropriate  Cognitive:  Alert  Insight: Good  Engagement in Group:  Engaged  Modes of Intervention:  Discussion  Additional Comments  Jacalyn Lefevre 03/23/2021, 10:51 PM

## 2021-03-24 DIAGNOSIS — F322 Major depressive disorder, single episode, severe without psychotic features: Principal | ICD-10-CM

## 2021-03-24 MED ORDER — HYDROXYZINE HCL 25 MG PO TABS: 25.0000 mg | ORAL_TABLET | Freq: Three times a day (TID) | ORAL | 0 refills | Status: AC | PRN

## 2021-03-24 MED ORDER — GABAPENTIN 100 MG PO CAPS: 100.0000 mg | ORAL_CAPSULE | Freq: Three times a day (TID) | ORAL | 0 refills | Status: AC

## 2021-03-24 MED ORDER — NICOTINE 21 MG/24HR TD PT24: 21.0000 mg | MEDICATED_PATCH | Freq: Every day | TRANSDERMAL | 0 refills | Status: AC

## 2021-03-24 MED ORDER — TRAZODONE HCL 100 MG PO TABS: 100.0000 mg | ORAL_TABLET | Freq: Every day | ORAL | 0 refills | Status: AC

## 2021-03-24 MED ORDER — ESCITALOPRAM OXALATE 10 MG PO TABS: 10.0000 mg | ORAL_TABLET | Freq: Every day | ORAL | 0 refills | Status: AC

## 2021-03-24 NOTE — [facility] Suicide Risk Assessment (Addendum)
Suicide Risk Assessment  Discharge Assessment    [facility] Discharge Suicide Risk Assessment   Principal Problem: MDD (major depressive disorder), single episode, severe ([place]) Discharge Diagnoses: Principal Problem:   MDD (major depressive disorder), single episode, severe ([place]) Active Problems:   Suicidal ideations   Reason for Admission: Earl Mccarthy, 31 y.o. male who walked into the [facility]) with complaints of worsening depression, suicidal ideations with a plan to jump over a bridge. Pt was transferred to this [facility] for treatment and stabilization of his mood   HOSPITAL COURSE:  During the patient's hospitalization, patient had extensive initial psychiatric evaluation, and follow-up psychiatric evaluations every day.  Psychiatric diagnoses provided upon initial assessment:  MDD (major depressive disorder), single episode, severe ([place])  Patient's psychiatric medications were adjusted on admission: -Start Lexapro 10 mg daily  During the hospitalization, other adjustments were made to the patient's psychiatric medication regimen as follows:   MDD (major depressive disorder), single episode severe, without psychosis (HCC)  -Continue Lexapro 10 mg daily   Elevated AST- resolved -Hepatitis panel negative; repeat LFTS normalized (AST 40 and ALT 40)   Chronically elevated creatinine (r/o CKI) - BMP rechecked and encouragin po hydration-CR-1.27 from 1/8 - Needs PCP f/u after discharge-Educated on this   Insomnia -Continue Trazodone to 100 mg nightly   Anxiety -Continue Hydroxyzine 25 mg every 6 hours PRN  -Start Neurontin 100 mg TID for pain & anxiety  Patient's care was discussed during the interdisciplinary team meeting every day during the hospitalization.  The patient denies having side effects to prescribed psychiatric medication.  The patient was evaluated each day by a clinical provider to ascertain response  to treatment. Improvement was noted by the patient's report of decreasing symptoms, improved sleep and appetite, affect, medication tolerance, behavior, and participation in unit programming.  Patient was asked each day to complete a self inventory noting mood, mental status, pain, new symptoms, anxiety and concerns.   Symptoms were reported as significantly decreased  by discharge.  The patient reports that their mood is stable.  The patient denied having suicidal thoughts for more than 48 hours prior to discharge.  Patient denies having homicidal thoughts.  Patient denies having auditory hallucinations.  Patient denies any visual hallucinations or other symptoms of psychosis.  The patient was motivated to continue taking medication with a goal of continued improvement in mental health.   The patient reports their target psychiatric symptoms of depression responded well to the psychiatric medications, and the patient reports overall benefit other psychiatric hospitalization. Supportive psychotherapy was provided to the patient. The patient also participated in regular group therapy while hospitalized. Coping skills, problem solving as well as relaxation therapies were also part of the unit programming.  Labs were reviewed with the patient, and abnormal results were discussed with the patient.  Musculoskeletal: Strength & Muscle Tone: within normal limits Gait & Station: normal Patient leans: N/A  Psychiatric Specialty Exam  Presentation  General Appearance: Appropriate for Environment; Well Groomed  Eye Contact:Good  Speech:Clear and Coherent  Speech Volume:Normal  Handedness:Right   Mood and Affect  Mood:Euthymic  Duration of Depression Symptoms: Greater than two weeks  Affect:Appropriate   Thought Process  Thought Processes:Coherent  Descriptions of Associations:Intact  Orientation:Full (Time, Place and Person)  Thought Content:Logical  History of  Schizophrenia/Schizoaffective disorder:No  Duration of Psychotic Symptoms:No data recorded Hallucinations:Hallucinations: None  Ideas of Reference:None  Suicidal Thoughts:Suicidal Thoughts: No  Homicidal Thoughts:Homicidal Thoughts: No   Sensorium  Memory:Immediate Good  Judgment:Good  Insight:Good   Executive Functions  Concentration:Good  Attention Span:Good  [place] of Knowledge:Good  Language:Good   Psychomotor Activity  Psychomotor Activity:Psychomotor Activity: Normal   Assets  Assets:Communication Skills   Sleep  Sleep:Sleep: Good   Physical Exam: Physical Exam HENT:     Head: Normocephalic.     Right Ear: There is no impacted cerumen.     Left Ear: There is no impacted cerumen.     Nose: No congestion or rhinorrhea.     Mouth/Throat:     Pharynx: No oropharyngeal exudate or posterior oropharyngeal erythema.  Cardiovascular:     Heart sounds: No murmur heard.   No friction rub.  Pulmonary:     Effort: Pulmonary effort is normal.  Abdominal:     General: There is no distension.     Palpations: There is no mass.  Musculoskeletal:        General: No swelling or deformity.     Cervical back: Normal range of motion.  Skin:    General: Skin is warm.  Neurological:     Mental Status: He is alert and oriented to person, place, and time.     Sensory: No sensory deficit.     Motor: No weakness.     Coordination: Coordination normal.  Psychiatric:        Behavior: Behavior normal.        Thought Content: Thought content normal.   Review of Systems  Constitutional: Negative.   HENT: Negative.    Eyes: Negative.   Respiratory: Negative.    Cardiovascular: Negative.   Gastrointestinal: Negative.   Genitourinary: Negative.   Musculoskeletal: Negative.   Skin: Negative.   Neurological: Negative.  Negative for dizziness, tingling, tremors, sensory change and headaches.  Psychiatric/Behavioral:  Positive for depression (improving  with medications) and substance abuse (history of THC abuse and educated on the need to abstain from use). Negative for suicidal ideas.   Blood pressure 125/84, pulse 82, temperature 97.9 F (36.6 C), temperature source Oral, resp. rate 18, height 6\' 4"  (1.93 m), weight (!) 137 kg, SpO2 98 %. Body mass index is 36.76 kg/m.  Mental Status Per Nursing Assessment::   On Admission:  NA  Demographic Factors:  Male  Loss Factors: NA  Historical Factors: Prior suicide attempts and Impulsivity  Risk Reduction Factors:   Responsible for children under 80 years of age, Living with another person, especially a relative, and Positive social support  Continued Clinical Symptoms: N/A   Cognitive Features That Contribute To Risk:  None    Suicide Risk:  Minimal: No identifiable suicidal ideation.  Patients presenting with no risk factors but with morbid ruminations; may be classified as minimal risk based on the severity of the depressive symptoms  Plan Of Care/Follow-up recommendations:  The patient is able to verbalize their individual safety plan to this provider.  # It is recommended to the patient to continue psychiatric medications as prescribed, after discharge from the hospital.    # It is recommended to the patient to follow up with your outpatient psychiatric provider and PCP.  # It was discussed with the patient, the impact of alcohol, drugs, tobacco have been there overall psychiatric and medical wellbeing, and total abstinence from substance use was recommended the patient.ed.  # Prescriptions provided or sent directly to preferred pharmacy at discharge. Patient agreeable to plan. Given opportunity to ask questions. Appears to feel comfortable with discharge.    # In the event  of worsening symptoms, the patient is instructed to call the crisis hotline, 911 and or go to the nearest ED for appropriate evaluation and treatment of symptoms. To follow-up with primary care provider  for other medical issues, concerns and or health care needs  # Patient was discharged home with a plan to follow up as noted below.    Follow-up Information     Aquilla Solian, LMFT Follow up on 03/29/2021.   Why: You have an appointment for therapy services on 03/29/21 at 4:00 pm.  This appointment will be Virtual.   * YOU MUST GO ONLINE TO SIGN THE HIPAA FORM TO KEEP THIS APPT. Contact information: [address] [place], [place] [address]  Phone: [phone]        [facility] Follow up.   Why: You have an appointment for medication management on 04/19/20 at 11am. This appointment will be in person. Contact information: [place] [address] [phone]         [facility]. Go on 04/20/2021.   Specialty: Family Medicine Why: You have a hospital follow up appointment for primary care services on 04/20/21 9:15 am.  This appointment will be held in person. Contact information: [place] [phone] [place] Follow up.   Specialty: Professional Counselor Why: You may walk into this facility Monday- Friday 8:30am-12:30pm or 1-2:30pm for intake for couples counseling. Contact information: [facility] [place] [address] [phone]                 Nicholes Rough, NP 03/24/2021, 10:24 AM

## 2021-03-24 NOTE — Progress Notes (Addendum)
° ° °  D.. Pt presents with an anxious affect, pleasant mood, calm cooperative behavior- reported that he is looking forward to being discharged today, ready to get back to his children. Per pt's self inventory, pt rated his depression, hopelessness and anxiety a 0/0/2, respectively.  Pt currently denies SI/HI and AVH. Pt observed in the milieu interacting well with peers and staff.  A. Labs and vitals monitored. Pt given and educated on medications. Pt supported emotionally and encouraged to express concerns and ask questions.   R. Pt remains safe with 15 minute checks. Will continue POC.     03/24/21 0900  Psych Admission Type (Psych Patients Only)  Admission Status Voluntary  Psychosocial Assessment  Patient Complaints None  Eye Contact Brief  Facial Expression Anxious  Affect Appropriate to circumstance  Speech Logical/coherent  Interaction Assertive  Motor Activity Other (Comment) (wnl)  Appearance/Hygiene Unremarkable  Behavior Characteristics Cooperative;Appropriate to situation  Mood Anxious;Pleasant  Aggressive Behavior  Targets Self  Type of Behavior Verbal  Effect No apparent injury  Thought Process  Coherency WDL  Content WDL  Delusions None reported or observed  Perception WDL  Hallucination None reported or observed  Judgment WDL  Confusion None  Danger to Self  Current suicidal ideation? Denies  Self-Injurious Behavior No self-injurious ideation or behavior indicators observed or expressed   Agreement Not to Harm Self Yes  Description of Agreement contracts for safety vertbal  Danger to Others  Danger to Others None reported or observed

## 2021-03-24 NOTE — Progress Notes (Signed)
Pt discharged to lobby where his aunt is waiting for him. Pt was stable and appreciative at that time. All papers and electronic prescriptions were given and valuables returned. Verbal understanding expressed. Denies SI/HI and A/VH. Pt given opportunity to express concerns and ask questions.

## 2021-03-24 NOTE — Progress Notes (Signed)
°   03/23/21 2045  Psych Admission Type (Psych Patients Only)  Admission Status Voluntary  Psychosocial Assessment  Patient Complaints Anxiety  Eye Contact Brief  Facial Expression Anxious  Affect Appropriate to circumstance  Speech Logical/coherent  Interaction Assertive  Motor Activity Other (Comment) (wnl)  Appearance/Hygiene Unremarkable  Behavior Characteristics Cooperative;Anxious  Mood Anxious;Pleasant  Thought Process  Coherency WDL  Content WDL  Delusions None reported or observed  Perception WDL  Hallucination None reported or observed  Judgment WDL  Confusion None  Danger to Self  Current suicidal ideation? Denies  Danger to Others  Danger to Others None reported or observed   Pt seen in dayroom. Denies SI, HI, AVH. Pt endorses pain 4/10 as backache from the bed. Pt rates anxiety 3/10 and denies depression. Pt will stay with aunt after discharge. Groups ar ehelping him understand his actions and what is behind things he has done. Hopeful for the future. Ready to get back to work and to his children.

## 2021-03-24 NOTE — Progress Notes (Signed)
°  [facility] Adult Case Management Discharge Plan :  Will you be returning to the same living situation after discharge:  No. Aunt's home At discharge, do you have transportation home?: Yes,  aunt Do you have the ability to pay for your medications: Yes,  private insurance  Release of information consent forms completed and sent to medical records;  Patient's signature obtained prior to  discharge.  Patient to Follow up at:  Follow-up Information     Madison Hickman, LMFT Follow up on 03/29/2021.   Why: You have an appointment for therapy services on 03/29/21 at 4:00 pm.  This appointment will be Virtual.   * YOU MUST GO ONLINE TO SIGN THE HIPAA FORM TO KEEP THIS APPT. Contact information: [address] [place], [place] [address]  Phone: [phone]        [facility] Follow up.   Why: You have an appointment for medication management on 04/19/20 at 11am. This appointment will be in person. Contact information: [address] [place] [address] [phone]         [facility]. Go on 04/20/2021.   Specialty: Family Medicine Why: You have a hospital follow up appointment for primary care services on 04/20/21 9:15 am.  This appointment will be held in person. Contact information: [place] [address] [phone]        [facility] Follow up.   Specialty: Professional Counselor Why: You may walk into this facility Monday- Friday 8:30am-12:30pm or 1-2:30pm for intake for couples counseling. Contact information: [facility] [place] [address] [place] [address] [phone]                 Next level of care provider has access to [facility] Link:no  Safety Planning and Suicide Prevention discussed: Yes,  aunt and girlfriend     Has patient been referred to the Quitline?: Patient refused referral  Patient has been referred for addiction  treatment: N/A  Felizardo Hoffmann, Theresia Majors 03/24/2021, 10:27 AM

## 2021-03-24 NOTE — Group Note (Signed)
Date:  03/24/2021 Time:  9:29 AM  Group Topic/Focus:  Orientation:   The focus of this group is to educate the patient on the purpose and policies of crisis stabilization and provide a format to answer questions about their admission.  The group details unit policies and expectations of patients while admitted.    Participation Level:  Active  Participation Quality:  Appropriate  Affect:  Appropriate  Cognitive:  Appropriate  Insight: Appropriate  Engagement in Group:  Engaged  Modes of Intervention:  Discussion  Additional Comments:  Pt was engaged in group.  Jaquita Rector 03/24/2021, 9:29 AM

## 2021-03-24 NOTE — Discharge Summary (Signed)
Physician Discharge Summary Note  Patient:  Earl Mccarthy is an 31 y.o., male MRN:  [id number] DOB:  1991-01-25 Patient phone:  [phone] (home)  Patient address:   [address] [place] [address],  Total Time spent with patient: 30 minutes  Date of Admission:  03/19/2021 Date of Discharge: 03/24/2021  Reason for Admission:  Earl Mccarthy, 31 y.o. male who walked into the [facility]) with complaints of worsening depression, suicidal ideations with a plan to jump over a bridge. Pt was transferred to this [facility] for treatment and stabilization of his mood.  Principal Problem: MDD (major depressive disorder), single episode, severe ([place]) Discharge Diagnoses: Principal Problem:   MDD (major depressive disorder), single episode, severe ([place]) Active Problems:   Suicidal ideations  Past Psychiatric History: MDD  Past Medical History: History reviewed. No pertinent past medical history. History reviewed. No pertinent surgical history. Family History:  Family History  Problem Relation Age of Onset   Cancer Mother    Hypertension Father    Family Psychiatric  History: N/A Social History:  Social History   Substance and Sexual Activity  Alcohol Use Not Currently     Social History   Substance and Sexual Activity  Drug Use Yes   Types: Marijuana   Comment: sometimes-socially "a few shots"     Social History   Socioeconomic History   Marital status: Single    Spouse name: Not on file   Number of children: Not on file   Years of education: Not on file   Highest education level: Not on file  Occupational History   Not on file  Tobacco Use   Smoking status: Every Day    Packs/day: 1.00    Years: 0.50    Pack years: 0.50    Types: Cigarettes   Smokeless tobacco: Never  Substance and Sexual Activity   Alcohol use: Not Currently   Drug use: Yes    Types: Marijuana    Comment: sometimes-socially "a  few shots"    Sexual activity: Yes    Birth control/protection: Condom  Other Topics Concern   Not on file  Social History Narrative   Not on file   Social Determinants of Health   Financial Resource Strain: Not on file  Food Insecurity: Not on file  Transportation Needs: Not on file  Physical Activity: Not on file  Stress: Not on file  Social Connections: Not on file    Hospital Course:  During the patient's hospitalization, patient had extensive initial psychiatric evaluation, and follow-up psychiatric evaluations every day.   Psychiatric diagnoses provided upon initial assessment:  MDD (major depressive disorder), single episode, severe ([place])   Patient's psychiatric medications were adjusted on admission: -Start Lexapro 10 mg daily   During the hospitalization, other adjustments were made to the patient's psychiatric medication regimen as follows:    MDD (major depressive disorder), single episode severe, without psychosis (H[facility])  -Continue Lexapro 10 mg daily   Elevated AST- resolved -Hepatitis panel negative; repeat LFTS normalized (AST 40 and ALT 40)   Chronically elevated creatinine (r/o CKI) - BMP rechecked and encouragin po hydration-CR-1.27 from 1/8 - Needs PCP f/u after discharge-Educated on this   Insomnia -Continue Trazodone to 100 mg nightly   Anxiety -Continue Hydroxyzine 25 mg every 6 hours PRN  -Start Neurontin 100 mg TID for pain & anxiety   Patient's care was discussed during the interdisciplinary team meeting every day during the hospitalization.  The patient denies having side effects to prescribed psychiatric medication.   The patient was evaluated each day by a clinical provider to ascertain response to treatment. Improvement was noted by the patient's report of decreasing symptoms, improved sleep and appetite, affect, medication tolerance, behavior, and participation in unit programming.  Patient was asked each day to complete a self inventory  noting mood, mental status, pain, new symptoms, anxiety and concerns.   Symptoms were reported as significantly decreased  by discharge.  The patient reports that their mood is stable.  The patient denied having suicidal thoughts for more than 48 hours prior to discharge.  Patient denies having homicidal thoughts.  Patient denies having auditory hallucinations.  Patient denies any visual hallucinations or other symptoms of psychosis.  The patient was motivated to continue taking medication with a goal of continued improvement in mental health.    The patient reports their target psychiatric symptoms of depression responded well to the psychiatric medications, and the patient reports overall benefit other psychiatric hospitalization. Supportive psychotherapy was provided to the patient. The patient also participated in regular group therapy while hospitalized. Coping skills, problem solving as well as relaxation therapies were also part of the unit programming.   Labs were reviewed with the patient, and abnormal results were discussed with the patient.  Physical Findings: AIMS: Facial and Oral Movements Muscles of Facial Expression: None, normal Lips and Perioral Area: None, normal Jaw: None, normal Tongue: None, normal,Extremity Movements Upper (arms, wrists, hands, fingers): None, normal Lower (legs, knees, ankles, toes): None, normal, Trunk Movements Neck, shoulders, hips: None, normal, Overall Severity Severity of abnormal movements (highest score from questions above): None, normal Incapacitation due to abnormal movements: None, normal Patient's awareness of abnormal movements (rate only patient's report): No Awareness, Dental Status Current problems with teeth and/or dentures?: Yes Does patient usually wear dentures?: No  CIWA: n/a COWS: n/a  AIMS-0 Musculoskeletal: Strength & Muscle Tone: within normal limits Gait & Station: normal Patient leans: N/A  Psychiatric Specialty  Exam:  Presentation  General Appearance: Appropriate for Environment; Well Groomed  Eye Contact:Good  Speech:Clear and Coherent  Speech Volume:Normal  Handedness:Right  Mood and Affect  Mood:Euthymic  Affect:Appropriate  Thought Process  Thought Processes:Coherent  Descriptions of Associations:Intact  Orientation:Full (Time, Place and Person)  Thought Content:Logical  History of Schizophrenia/Schizoaffective disorder:No  Duration of Psychotic Symptoms:No data recorded Hallucinations:Hallucinations: None  Ideas of Reference:None  Suicidal Thoughts:Suicidal Thoughts: No  Homicidal Thoughts:Homicidal Thoughts: No  Sensorium  Memory:Immediate Good  Judgment:Good  Insight:Good  Executive Functions  Concentration:Good  Attention Span:Good  [place] of Knowledge:Good  Language:Good  Psychomotor Activity  Psychomotor Activity:Psychomotor Activity: Normal  Assets  Assets:Communication Skills  Sleep  Sleep:Sleep: Good  Physical Exam: Physical Exam HENT:     Head: Normocephalic.     Right Ear: There is no impacted cerumen.     Left Ear: There is no impacted cerumen.     Nose: No congestion or rhinorrhea.     Mouth/Throat:     Pharynx: No oropharyngeal exudate or posterior oropharyngeal erythema.  Pulmonary:     Effort: Pulmonary effort is normal.     Breath sounds: Normal breath sounds.  Abdominal:     General: There is no distension.     Palpations: There is no mass.  Musculoskeletal:        General: Normal range of motion.     Cervical back: Normal range of motion. No rigidity or tenderness.  Skin:    General: Skin  is warm and dry.  Neurological:     General: No focal deficit present.     Mental Status: He is alert and oriented to person, place, and time.     Sensory: No sensory deficit.     Motor: No weakness.     Coordination: Coordination normal.     Gait: Gait normal.  Psychiatric:        Behavior: Behavior normal.         Thought Content: Thought content normal.        Judgment: Judgment normal.   Review of Systems  Constitutional: Negative.   HENT: Negative.    Eyes: Negative.   Respiratory: Negative.  Negative for cough and shortness of breath.   Cardiovascular: Negative.   Gastrointestinal: Negative.  Negative for constipation, heartburn, nausea and vomiting.  Genitourinary: Negative.  Negative for frequency and hematuria.  Musculoskeletal: Negative.  Negative for back pain, joint pain and neck pain.  Skin: Negative.   Neurological: Negative.  Negative for dizziness, tingling, tremors, sensory change, speech change, loss of consciousness, weakness and headaches.  Psychiatric/Behavioral:  Positive for depression (improving with medications) and substance abuse (marijuana abuse and educated against marijuana use while taking meds). Negative for hallucinations, memory loss and suicidal ideas. The patient is not nervous/anxious and does not have insomnia.   Blood pressure 125/84, pulse 82, temperature 97.9 F (36.6 C), temperature source Oral, resp. rate 18, height 6\' 4"  (1.93 m), weight (!) 137 kg, SpO2 98 %. Body mass index is 36.76 kg/m.   Social History   Tobacco Use  Smoking Status Every Day   Packs/day: 1.00   Years: 0.50   Pack years: 0.50   Types: Cigarettes  Smokeless Tobacco Never   Tobacco Cessation:  N/A, patient does not currently use tobacco products   Blood Alcohol level:  Lab Results  Component Value Date   ETH <10 03/18/2021   ETH <11 123XX123    Metabolic Disorder Labs:  Lab Results  Component Value Date   HGBA1C 5.3 03/18/2021   MPG 105.41 03/18/2021   No results found for: PROLACTIN Lab Results  Component Value Date   CHOL 185 03/18/2021   TRIG 119 03/18/2021   HDL 35 (L) 03/18/2021   CHOLHDL 5.3 03/18/2021   VLDL 24 03/18/2021   LDLCALC 126 (H) 03/18/2021    See Psychiatric Specialty Exam and Suicide Risk Assessment completed by Attending Physician prior  to discharge.  Discharge destination:  Home  Is patient on multiple antipsychotic therapies at discharge:  No   Has Patient had three or more failed trials of antipsychotic monotherapy by history:  No  Recommended Plan for Multiple Antipsychotic Therapies: NA  Discharge Instructions     Diet - Mccarthy sodium heart healthy   Complete by: As directed    Increase activity slowly   Complete by: As directed       Allergies as of 03/24/2021       Reactions   Bee Venom Anaphylaxis   Strawberry Extract Anaphylaxis        Medication List     TAKE these medications      Indication  escitalopram 10 MG tablet Commonly known as: LEXAPRO Take 1 tablet (10 mg total) by mouth daily. Start taking on: March 25, 2021  Indication: Major Depressive Disorder   gabapentin 100 MG capsule Commonly known as: NEURONTIN Take 1 capsule (100 mg total) by mouth 3 (three) times daily.  Indication: Neuropathic Pain, Pan and anxiety   hydrOXYzine 25  MG tablet Commonly known as: ATARAX Take 1 tablet (25 mg total) by mouth 3 (three) times daily as needed for anxiety.  Indication: Feeling Anxious   nicotine 21 mg/24hr patch Commonly known as: NICODERM CQ - dosed in mg/24 hours Place 1 patch (21 mg total) onto the skin daily at 6 (six) AM. Start taking on: March 25, 2021  Indication: Nicotine Addiction   traZODone 100 MG tablet Commonly known as: DESYREL Take 1 tablet (100 mg total) by mouth at bedtime.  Indication: [place], LMFT Follow up on 03/29/2021.   Why: You have an appointment for therapy services on 03/29/21 at 4:00 pm.  This appointment will be Virtual.   * YOU MUST GO ONLINE TO SIGN THE HIPAA FORM TO KEEP THIS APPT. Contact information: [address] [place], [place] [address]  Phone: [phone]        [facility] Follow up.   Why: You have an appointment for medication management on 04/19/20 at 11am. This  appointment will be in person. Contact information: [place] [address] [phone]         [place]. Go on 04/20/2021.   Specialty: Family Medicine Why: You have a hospital follow up appointment for primary care services on 04/20/21 9:15 am.  This appointment will be held in person. Contact information: [place] [phone] [facility] Follow up.   Specialty: Professional Counselor Why: You may walk into this facility Monday- Friday 8:30am-12:30pm or 1-2:30pm for intake for couples counseling. Contact information: [facility] [address] [phone]                 Follow-up recommendations:   The patient is able to verbalize their individual safety plan to this provider.   # It is recommended to the patient to continue psychiatric medications as prescribed, after discharge from the hospital.     # It is recommended to the patient to follow up with your outpatient psychiatric provider and PCP.   # It was discussed with the patient, the impact of alcohol, drugs, tobacco have been there overall psychiatric and medical wellbeing, and total abstinence from substance use was recommended the patient.ed.   # Prescriptions provided or sent directly to preferred pharmacy at discharge. Patient agreeable to plan. Given opportunity to ask questions. Appears to feel comfortable with discharge.    # In the event of worsening symptoms, the patient is instructed to call the crisis hotline, 911 and or go to the nearest ED for appropriate evaluation and treatment of symptoms. To follow-up with primary care provider for other medical issues, concerns and or health care needs   # Patient was discharged home with a plan to follow up as noted below.      Follow-up Information       [place],  LMFT Follow up on 03/29/2021.   Why: You have an appointment for therapy services on 03/29/21 at 4:00 pm.  This appointment will be Virtual.   * YOU MUST GO ONLINE TO SIGN THE HIPAA FORM TO KEEP THIS APPT. Contact information: [address] [place], [place] [address]   Phone: [phone]            [facility]  Follow up.   Why: You have an appointment for medication management on 04/19/20 at 11am. This appointment will be in person. Contact information: [place] [address] [phone]              [place]. Go on 04/20/2021.   Specialty: Family Medicine Why: You have a hospital follow up appointment for primary care services on 04/20/21 9:15 am.  This appointment will be held in person. Contact information: [place] [phone] [place] Follow up.   Specialty: Professional Counselor Why: You may walk into this facility Monday- Friday 8:30am-12:30pm or 1-2:30pm for intake for couples counseling. Contact information: [facility] [place] [address] [phone]       Signed: Nicholes Rough, NP 03/24/2021, 4:38 PM

## 2021-03-26 NOTE — [facility] Group Notes (Signed)
Spiritual care group on grief and loss facilitated by chaplain Dyanne Carrel, [facility]   Group Goal:   Support / Education around grief and loss   Members engage in facilitated group support and psycho-social education.   Group Description:   Following introductions and group rules, group members engaged in facilitated group dialog and support around topic of loss, with particular support around experiences of loss in their lives. Group Identified types of loss (relationships / self / things) and identified patterns, circumstances, and changes that precipitate losses. Reflected on thoughts / feelings around loss, normalized grief responses, and recognized variety in grief experience. Group noted Worden's four tasks of grief in discussion.   Group drew on Adlerian / Rogerian, narrative, MI,   Patient Progress: Earl Mccarthy attended group and actively participated in conversation.  Comments were insightful and supported peers well.  Chaplain Dyanne Carrel, [facility] Pager, [phone] 11:51 AM

## 2021-03-30 ENCOUNTER — Telehealth (HOSPITAL_COMMUNITY): Payer: Self-pay | Admitting: Emergency Medicine

## 2021-03-30 NOTE — BH Assessment (Signed)
Care Management - [place] Follow Up Discharges   Patient has been placed in an inpatient psychiatric hospital ([facility]) on 03-18-21

## 2021-04-20 ENCOUNTER — Inpatient Hospital Stay (INDEPENDENT_AMBULATORY_CARE_PROVIDER_SITE_OTHER): Payer: BC Managed Care – PPO | Admitting: Primary Care

## 2021-06-11 DIAGNOSIS — F4310 Post-traumatic stress disorder, unspecified: Secondary | ICD-10-CM | POA: Diagnosis not present

## 2021-06-18 DIAGNOSIS — F4310 Post-traumatic stress disorder, unspecified: Secondary | ICD-10-CM | POA: Diagnosis not present

## 2021-07-09 DIAGNOSIS — F4310 Post-traumatic stress disorder, unspecified: Secondary | ICD-10-CM | POA: Diagnosis not present

## 2021-08-20 DIAGNOSIS — F4310 Post-traumatic stress disorder, unspecified: Secondary | ICD-10-CM | POA: Diagnosis not present

## 2021-10-08 DIAGNOSIS — F4310 Post-traumatic stress disorder, unspecified: Secondary | ICD-10-CM | POA: Diagnosis not present

## 2021-10-15 DIAGNOSIS — F4310 Post-traumatic stress disorder, unspecified: Secondary | ICD-10-CM | POA: Diagnosis not present

## 2021-10-29 DIAGNOSIS — F4310 Post-traumatic stress disorder, unspecified: Secondary | ICD-10-CM | POA: Diagnosis not present

## 2021-11-26 DIAGNOSIS — F4310 Post-traumatic stress disorder, unspecified: Secondary | ICD-10-CM | POA: Diagnosis not present

## 2022-01-07 DIAGNOSIS — F4310 Post-traumatic stress disorder, unspecified: Secondary | ICD-10-CM | POA: Diagnosis not present

## 2022-03-04 DIAGNOSIS — F4310 Post-traumatic stress disorder, unspecified: Secondary | ICD-10-CM | POA: Diagnosis not present

## 2022-04-08 DIAGNOSIS — F4310 Post-traumatic stress disorder, unspecified: Secondary | ICD-10-CM | POA: Diagnosis not present

## 2023-03-16 DIAGNOSIS — Z1389 Encounter for screening for other disorder: Secondary | ICD-10-CM | POA: Diagnosis not present

## 2023-03-16 DIAGNOSIS — R6889 Other general symptoms and signs: Secondary | ICD-10-CM | POA: Diagnosis not present

## 2023-03-16 DIAGNOSIS — G4733 Obstructive sleep apnea (adult) (pediatric): Secondary | ICD-10-CM | POA: Diagnosis not present

## 2024-03-12 DIAGNOSIS — F4310 Post-traumatic stress disorder, unspecified: Secondary | ICD-10-CM | POA: Diagnosis not present
